# Patient Record
Sex: Female | Born: 1960 | Race: White | Hispanic: No | Marital: Single | Smoking: Never smoker
Health system: Southern US, Community
[De-identification: ages and names within clinical notes are randomized; demographics above are authoritative.]

## PROBLEM LIST (undated history)

## (undated) DIAGNOSIS — E079 Disorder of thyroid, unspecified: Secondary | ICD-10-CM

## (undated) DIAGNOSIS — F32A Depression, unspecified: Secondary | ICD-10-CM

## (undated) DIAGNOSIS — E063 Autoimmune thyroiditis: Secondary | ICD-10-CM

## (undated) DIAGNOSIS — T7840XA Allergy, unspecified, initial encounter: Secondary | ICD-10-CM

## (undated) DIAGNOSIS — F329 Major depressive disorder, single episode, unspecified: Secondary | ICD-10-CM

## (undated) DIAGNOSIS — Z808 Family history of malignant neoplasm of other organs or systems: Secondary | ICD-10-CM

## (undated) DIAGNOSIS — F419 Anxiety disorder, unspecified: Secondary | ICD-10-CM

## (undated) DIAGNOSIS — Z803 Family history of malignant neoplasm of breast: Secondary | ICD-10-CM

## (undated) DIAGNOSIS — E785 Hyperlipidemia, unspecified: Secondary | ICD-10-CM

## (undated) DIAGNOSIS — Z800 Family history of malignant neoplasm of digestive organs: Secondary | ICD-10-CM

## (undated) DIAGNOSIS — Z8042 Family history of malignant neoplasm of prostate: Secondary | ICD-10-CM

## (undated) DIAGNOSIS — Z8041 Family history of malignant neoplasm of ovary: Secondary | ICD-10-CM

## (undated) DIAGNOSIS — D649 Anemia, unspecified: Secondary | ICD-10-CM

## (undated) HISTORY — DX: Autoimmune thyroiditis: E06.3

## (undated) HISTORY — PX: COLONOSCOPY: SHX174

## (undated) HISTORY — PX: MYRINGOTOMY: SUR874

## (undated) HISTORY — DX: Family history of malignant neoplasm of breast: Z80.3

## (undated) HISTORY — DX: Allergy, unspecified, initial encounter: T78.40XA

## (undated) HISTORY — DX: Family history of malignant neoplasm of ovary: Z80.41

## (undated) HISTORY — PX: KNEE SURGERY: SHX244

## (undated) HISTORY — DX: Family history of malignant neoplasm of prostate: Z80.42

## (undated) HISTORY — DX: Anemia, unspecified: D64.9

## (undated) HISTORY — DX: Major depressive disorder, single episode, unspecified: F32.9

## (undated) HISTORY — PX: ADENOIDECTOMY: SUR15

## (undated) HISTORY — DX: Hyperlipidemia, unspecified: E78.5

## (undated) HISTORY — DX: Family history of malignant neoplasm of digestive organs: Z80.0

## (undated) HISTORY — DX: Family history of malignant neoplasm of other organs or systems: Z80.8

## (undated) HISTORY — DX: Anxiety disorder, unspecified: F41.9

## (undated) HISTORY — DX: Depression, unspecified: F32.A

---

## 2000-10-02 ENCOUNTER — Other Ambulatory Visit: Admission: RE | Admit: 2000-10-02 | Discharge: 2000-10-02 | Payer: Self-pay | Admitting: *Deleted

## 2001-09-29 ENCOUNTER — Other Ambulatory Visit: Admission: RE | Admit: 2001-09-29 | Discharge: 2001-09-29 | Payer: Self-pay | Admitting: Obstetrics and Gynecology

## 2002-04-20 ENCOUNTER — Other Ambulatory Visit: Admission: RE | Admit: 2002-04-20 | Discharge: 2002-04-20 | Payer: Self-pay | Admitting: Obstetrics and Gynecology

## 2002-10-10 ENCOUNTER — Other Ambulatory Visit: Admission: RE | Admit: 2002-10-10 | Discharge: 2002-10-10 | Payer: Self-pay | Admitting: Obstetrics and Gynecology

## 2003-07-20 ENCOUNTER — Encounter: Payer: Self-pay | Admitting: Emergency Medicine

## 2003-07-20 ENCOUNTER — Encounter (INDEPENDENT_AMBULATORY_CARE_PROVIDER_SITE_OTHER): Payer: Self-pay | Admitting: *Deleted

## 2003-07-21 ENCOUNTER — Encounter: Payer: Self-pay | Admitting: Internal Medicine

## 2003-07-21 ENCOUNTER — Encounter: Payer: Self-pay | Admitting: Emergency Medicine

## 2003-07-21 ENCOUNTER — Inpatient Hospital Stay (HOSPITAL_COMMUNITY): Admission: EM | Admit: 2003-07-21 | Discharge: 2003-07-22 | Payer: Self-pay | Admitting: Emergency Medicine

## 2003-08-03 ENCOUNTER — Encounter: Admission: RE | Admit: 2003-08-03 | Discharge: 2003-08-03 | Payer: Self-pay | Admitting: Internal Medicine

## 2003-08-11 ENCOUNTER — Other Ambulatory Visit: Admission: RE | Admit: 2003-08-11 | Discharge: 2003-08-11 | Payer: Self-pay | Admitting: Obstetrics and Gynecology

## 2003-09-08 ENCOUNTER — Ambulatory Visit (HOSPITAL_COMMUNITY): Admission: RE | Admit: 2003-09-08 | Discharge: 2003-09-08 | Payer: Self-pay | Admitting: Internal Medicine

## 2003-09-08 ENCOUNTER — Encounter: Payer: Self-pay | Admitting: Internal Medicine

## 2003-09-12 ENCOUNTER — Encounter: Admission: RE | Admit: 2003-09-12 | Discharge: 2003-09-12 | Payer: Self-pay | Admitting: Internal Medicine

## 2004-11-08 ENCOUNTER — Other Ambulatory Visit: Admission: RE | Admit: 2004-11-08 | Discharge: 2004-11-08 | Payer: Self-pay | Admitting: Obstetrics and Gynecology

## 2005-12-30 ENCOUNTER — Other Ambulatory Visit: Admission: RE | Admit: 2005-12-30 | Discharge: 2005-12-30 | Payer: Self-pay | Admitting: Obstetrics and Gynecology

## 2006-02-27 ENCOUNTER — Ambulatory Visit: Payer: Self-pay | Admitting: Internal Medicine

## 2006-06-05 ENCOUNTER — Ambulatory Visit: Payer: Self-pay | Admitting: Internal Medicine

## 2006-07-06 ENCOUNTER — Ambulatory Visit: Payer: Self-pay | Admitting: Internal Medicine

## 2006-08-11 ENCOUNTER — Ambulatory Visit: Payer: Self-pay | Admitting: Internal Medicine

## 2006-08-18 ENCOUNTER — Ambulatory Visit: Payer: Self-pay | Admitting: Internal Medicine

## 2007-06-26 ENCOUNTER — Ambulatory Visit: Payer: Self-pay | Admitting: Internal Medicine

## 2007-10-28 ENCOUNTER — Telehealth: Payer: Self-pay | Admitting: Internal Medicine

## 2007-10-29 ENCOUNTER — Ambulatory Visit: Payer: Self-pay | Admitting: Internal Medicine

## 2008-04-22 ENCOUNTER — Ambulatory Visit: Payer: Self-pay | Admitting: Internal Medicine

## 2008-05-05 ENCOUNTER — Telehealth: Payer: Self-pay | Admitting: Internal Medicine

## 2008-12-08 HISTORY — PX: OTHER SURGICAL HISTORY: SHX169

## 2009-05-17 ENCOUNTER — Ambulatory Visit: Payer: Self-pay | Admitting: Internal Medicine

## 2009-05-17 LAB — CONVERTED CEMR LAB
ALT: 16 units/L (ref 0–35)
AST: 19 units/L (ref 0–37)
Albumin: 4 g/dL (ref 3.5–5.2)
Alkaline Phosphatase: 57 units/L (ref 39–117)
BUN: 15 mg/dL (ref 6–23)
Basophils Absolute: 0 10*3/uL (ref 0.0–0.1)
Basophils Relative: 0.1 % (ref 0.0–3.0)
Bilirubin, Direct: 0 mg/dL (ref 0.0–0.3)
CO2: 26 meq/L (ref 19–32)
Calcium: 8.8 mg/dL (ref 8.4–10.5)
Chloride: 107 meq/L (ref 96–112)
Cholesterol: 195 mg/dL (ref 0–200)
Creatinine, Ser: 0.8 mg/dL (ref 0.4–1.2)
Eosinophils Absolute: 0.1 10*3/uL (ref 0.0–0.7)
Eosinophils Relative: 1.3 % (ref 0.0–5.0)
GFR calc non Af Amer: 81.71 mL/min (ref >60)
Glucose, Bld: 91 mg/dL (ref 70–99)
Glucose, Urine, Semiquant: NEGATIVE
HCT: 37.9 % (ref 36.0–46.0)
HDL: 50 mg/dL (ref >39.00)
Hemoglobin: 12.9 g/dL (ref 12.0–15.0)
Ketones, urine, test strip: NEGATIVE
LDL Cholesterol: 128 mg/dL — ABNORMAL HIGH (ref 0–99)
Lymphocytes Relative: 29.6 % (ref 12.0–46.0)
Lymphs Abs: 1.8 10*3/uL (ref 0.7–4.0)
MCHC: 34 g/dL (ref 30.0–36.0)
MCV: 92.4 fL (ref 78.0–100.0)
Monocytes Absolute: 0.5 10*3/uL (ref 0.1–1.0)
Monocytes Relative: 7.9 % (ref 3.0–12.0)
Neutro Abs: 3.8 10*3/uL (ref 1.4–7.7)
Neutrophils Relative %: 61.1 % (ref 43.0–77.0)
Nitrite: NEGATIVE
Platelets: 255 10*3/uL (ref 150.0–400.0)
Potassium: 4.5 meq/L (ref 3.5–5.1)
RBC: 4.1 M/uL (ref 3.87–5.11)
RDW: 12.5 % (ref 11.5–14.6)
Sodium: 139 meq/L (ref 135–145)
Specific Gravity, Urine: >=1.03
TSH: 2.32 microintl units/mL (ref 0.35–5.50)
Total Bilirubin: 0.8 mg/dL (ref 0.3–1.2)
Total CHOL/HDL Ratio: 4
Total Protein: 8 g/dL (ref 6.0–8.3)
Triglycerides: 84 mg/dL (ref 0.0–149.0)
Urobilinogen, UA: 0.2
VLDL: 16.8 mg/dL (ref 0.0–40.0)
WBC Urine, dipstick: NEGATIVE
WBC: 6.2 10*3/uL (ref 4.5–10.5)
pH: 5

## 2009-06-01 ENCOUNTER — Ambulatory Visit: Payer: Self-pay | Admitting: Internal Medicine

## 2009-06-01 DIAGNOSIS — E669 Obesity, unspecified: Secondary | ICD-10-CM | POA: Insufficient documentation

## 2009-06-01 DIAGNOSIS — F329 Major depressive disorder, single episode, unspecified: Secondary | ICD-10-CM | POA: Insufficient documentation

## 2010-07-24 ENCOUNTER — Ambulatory Visit: Payer: Self-pay | Admitting: Internal Medicine

## 2010-09-09 ENCOUNTER — Ambulatory Visit (HOSPITAL_COMMUNITY)
Admission: RE | Admit: 2010-09-09 | Discharge: 2010-09-09 | Payer: Self-pay | Source: Home / Self Care | Admitting: Internal Medicine

## 2010-09-09 ENCOUNTER — Ambulatory Visit: Payer: Self-pay | Admitting: Internal Medicine

## 2010-09-09 DIAGNOSIS — M19049 Primary osteoarthritis, unspecified hand: Secondary | ICD-10-CM | POA: Insufficient documentation

## 2010-09-09 LAB — CONVERTED CEMR LAB: CRP, High Sensitivity: 5.18 — ABNORMAL HIGH (ref 0.00–5.00)

## 2010-12-28 ENCOUNTER — Encounter: Payer: Self-pay | Admitting: Internal Medicine

## 2011-01-07 NOTE — Assessment & Plan Note (Signed)
Summary: joint pain/njr   Vital Signs:  Patient profile:   50 year old female Weight:      286 pounds BMI:     42.39 Temp:     99.1 degrees F oral BP sitting:   150 / 100  (left arm) Cuff size:   large  Vitals Entered By: Sid Falcon LPN (September 09, 2010 10:11 AM)  Serial Vital Signs/Assessments:  Time      Position  BP       Pulse  Resp  Temp     By                     120/85                         Birdie Sons MD   History of Present Illness: SEVERAL MONTH HX OF "JOINT PAIN" 3rd and 4th fingers of left hand are painful at DIP joint mornings hands are stiff resolve within a few minutes has not tried OTC meds  All other systems reviewed and were negative   Current Problems (verified): 1)  Obesity  (ICD-278.00) 2)  Preventive Health Care  (ICD-V70.0) 3)  Depression  (ICD-311)  Current Medications (verified): 1)  Fluoxetine Hcl 10 Mg  Caps (Fluoxetine Hcl) .Marland Kitchen.. 1 Once Daily  Allergies: 1)  Sulfamethoxazole (Sulfamethoxazole)  Physical Exam  General:  alert and well-developed.   Head:  normocephalic and atraumatic.   Eyes:  pupils equal and pupils round.   Ears:  R ear normal and L ear normal.   Neck:  supple and full ROM.   Lungs:  normal respiratory effort.   Heart:  normal rate and regular rhythm.   Abdomen:  soft and non-tender.   Msk:  heberdens and bouchards nodes 3 and 4 fingers left Skin:  turgor normal and color normal.   Psych:  good eye contact and not anxious appearing.     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 1 once daily  Appended Document:  Orders Update     Clinical Lists Changes  Orders: Added new Service order of Specimen Handling (84696) - Signed

## 2011-01-07 NOTE — Assessment & Plan Note (Signed)
Summary: SORE THROAT // RS   Vital Signs:  Patient profile:   50 year old female Weight:      277 pounds Temp:     97.9 degrees F oral BP sitting:   110 / 90  (left arm) Cuff size:   large  Vitals Entered By: Kathrynn Speed CMA (July 24, 2010 8:31 AM) CC: Sore throat since July 29th consistantly, some sinus drainage, swelling glands, src Is Patient Diabetic? No   CC:  Sore throat since July 29th consistantly, some sinus drainage, swelling glands, and src.  History of Present Illness: ST since 7/29 started with sneezing and other URI sxs ST is mild to moderate she notes some postnasal drainage and she feels like "glands" are sore---not swollen tried otc sinus meds---no results   In addition she is menopausal---a lot of night sweats.   Preventive Screening-Counseling & Management  Alcohol-Tobacco     Smoking Status: never  Current Medications (verified): 1)  Fluoxetine Hcl 10 Mg  Caps (Fluoxetine Hcl) .Marland Kitchen.. 1 Once Daily  Allergies (verified): 1)  Sulfamethoxazole (Sulfamethoxazole)  Past History:  Past Medical History: Last updated: 06/01/2009 Depression  Past Surgical History: Last updated: 06/01/2009 adenoidectomy myringotomy---age 36  Family History: Last updated: 06/01/2009 mother dm, lipids, htn Family History Breast cancer 1st degree relative- mother Family History Diabetes 1st degree relative Family History High cholesterol Family History Hypertension Family History Lung cancer-father age 62 yo  Social History: Last updated: 06/01/2009 no tob Single Never Smoked Alcohol use-yes < 1 daily 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   Head:  normocephalic and atraumatic.   Mouth:  pharyngeal erythema.  tonsils enlarged Neck:  supple and full ROM.   Lungs:  Normal respiratory effort, chest expands symmetrically. Lungs are clear to  auscultation, no crackles or wheezes. Heart:  normal rate and regular rhythm.     Impression & Recommendations:  Problem # 1:  SINUSITIS- ACUTE-NOS (ICD-461.9) Assessment Unchanged she understands this is best guess diagnosis will treat antibiotic  side effects discussed Her updated medication list for this problem includes:    Doxycycline Hyclate 100 Mg Caps (Doxycycline hyclate) .Marland Kitchen... Take 1 tab twice a day  Complete Medication List: 1)  Fluoxetine Hcl 10 Mg Caps (Fluoxetine hcl) .Marland Kitchen.. 1 once daily 2)  Doxycycline Hyclate 100 Mg Caps (Doxycycline hyclate) .... Take 1 tab twice a day Prescriptions: DOXYCYCLINE HYCLATE 100 MG CAPS (DOXYCYCLINE HYCLATE) Take 1 tab twice a day  #20 x 0   Entered and Authorized by:   Birdie Sons MD   Signed by:   Birdie Sons MD on 07/24/2010   Method used:   Electronically to        [facility]. [phone]* (retail)       [phone] [address]       [place], [place]  [address]       Ph: [phone]       Fax: [phone]   RxID:   [id number]

## 2011-05-28 ENCOUNTER — Other Ambulatory Visit (INDEPENDENT_AMBULATORY_CARE_PROVIDER_SITE_OTHER): Payer: PRIVATE HEALTH INSURANCE

## 2011-05-28 DIAGNOSIS — Z0000 Encounter for general adult medical examination without abnormal findings: Secondary | ICD-10-CM

## 2011-05-28 LAB — BASIC METABOLIC PANEL
CO2: 26 mEq/L (ref 19–32)
Calcium: 9.4 mg/dL (ref 8.4–10.5)
Chloride: 107 mEq/L (ref 96–112)
Creatinine, Ser: 0.7 mg/dL (ref 0.4–1.2)
Glucose, Bld: 89 mg/dL (ref 70–99)

## 2011-05-28 LAB — CBC WITH DIFFERENTIAL/PLATELET
Basophils Absolute: 0 10*3/uL (ref 0.0–0.1)
Basophils Relative: 0.4 % (ref 0.0–3.0)
Eosinophils Absolute: 0.1 10*3/uL (ref 0.0–0.7)
Lymphocytes Relative: 38.5 % (ref 12.0–46.0)
MCHC: 33.5 g/dL (ref 30.0–36.0)
MCV: 93.8 fl (ref 78.0–100.0)
Monocytes Absolute: 0.4 10*3/uL (ref 0.1–1.0)
Neutro Abs: 2.7 10*3/uL (ref 1.4–7.7)
Neutrophils Relative %: 51.8 % (ref 43.0–77.0)
RBC: 4.31 Mil/uL (ref 3.87–5.11)
RDW: 13.8 % (ref 11.5–14.6)

## 2011-05-28 LAB — POCT URINALYSIS DIPSTICK
Ketones, UA: NEGATIVE
Protein, UA: NEGATIVE
Spec Grav, UA: 1.015
Urobilinogen, UA: 0.2
pH, UA: 5

## 2011-05-28 LAB — HEPATIC FUNCTION PANEL
Albumin: 4.1 g/dL (ref 3.5–5.2)
Alkaline Phosphatase: 57 U/L (ref 39–117)
Bilirubin, Direct: 0.1 mg/dL (ref 0.0–0.3)
Total Protein: 6.8 g/dL (ref 6.0–8.3)

## 2011-05-28 LAB — LIPID PANEL
HDL: 59 mg/dL (ref >39.00)
Triglycerides: 75 mg/dL (ref 0.0–149.0)
VLDL: 15 mg/dL (ref 0.0–40.0)

## 2011-06-10 ENCOUNTER — Encounter: Payer: Self-pay | Admitting: Internal Medicine

## 2011-06-10 ENCOUNTER — Ambulatory Visit (INDEPENDENT_AMBULATORY_CARE_PROVIDER_SITE_OTHER): Payer: PRIVATE HEALTH INSURANCE | Admitting: Internal Medicine

## 2011-06-10 VITALS — BP 104/78 | HR 68 | Temp 98.5°F | Ht 69.25 in | Wt 281.0 lb

## 2011-06-10 DIAGNOSIS — Z0000 Encounter for general adult medical examination without abnormal findings: Secondary | ICD-10-CM

## 2011-06-10 DIAGNOSIS — E039 Hypothyroidism, unspecified: Secondary | ICD-10-CM

## 2011-06-10 MED ORDER — LEVOTHYROXINE SODIUM 50 MCG PO TABS: 50.0000 ug | ORAL_TABLET | Freq: Every day | ORAL | Status: DC

## 2011-06-10 NOTE — Progress Notes (Signed)
  Subjective:    Patient ID: Elizabeth Gentry, female    DOB: 03/04/1961, 50 y.o.   MRN: [id number]  HPI  cpx  Past Medical History  Diagnosis Date  . Depression    Past Surgical History  Procedure Date  . Adenoidectomy   . Myringotomy   . Uterine ablation- 2010 2010    dr holland for AUB    reports that she has never smoked. She does not have any smokeless tobacco history on file. She reports that she drinks about 1.8 ounces of alcohol per week. She reports that she does not use illicit drugs. family history includes Cancer in her father and mother; Cancer (age of onset:60) in her maternal grandmother; Diabetes in her mother; Hyperlipidemia in her mother; and Hypertension in her mother. Allergies  Allergen Reactions  . Sulfamethoxazole     REACTION: unspecified     Review of Systems  patient denies chest pain, shortness of breath, orthopnea. Denies lower extremity edema, abdominal pain, change in appetite, change in bowel movements. Patient denies rashes, musculoskeletal complaints. No other specific complaints in a complete review of systems.      Objective:   Physical Exam  Well-developed well-nourished female in no acute distress. HEENT exam atraumatic, normocephalic, extraocular muscles are intact. Neck is supple. No jugular venous distention no thyromegaly. Chest clear to auscultation without increased work of breathing. Cardiac exam S1 and S2 are regular. Abdominal exam active bowel sounds, soft, nontender. Extremities no edema. Neurologic exam she is alert without any motor sensory deficits. Gait is normal.        Assessment & Plan:

## 2011-09-09 ENCOUNTER — Other Ambulatory Visit (INDEPENDENT_AMBULATORY_CARE_PROVIDER_SITE_OTHER): Payer: PRIVATE HEALTH INSURANCE

## 2011-09-09 DIAGNOSIS — E039 Hypothyroidism, unspecified: Secondary | ICD-10-CM

## 2011-09-09 LAB — LIPID PANEL
HDL: 55.9 mg/dL (ref >39.00)
Total CHOL/HDL Ratio: 4
Triglycerides: 86 mg/dL (ref 0.0–149.0)

## 2011-09-09 LAB — TSH: TSH: 3.94 u[IU]/mL (ref 0.35–5.50)

## 2011-09-11 ENCOUNTER — Telehealth: Payer: Self-pay | Admitting: Internal Medicine

## 2011-09-11 DIAGNOSIS — E039 Hypothyroidism, unspecified: Secondary | ICD-10-CM

## 2011-09-11 DIAGNOSIS — E785 Hyperlipidemia, unspecified: Secondary | ICD-10-CM

## 2011-09-11 MED ORDER — LEVOTHYROXINE SODIUM 50 MCG PO TABS: 50.0000 ug | ORAL_TABLET | Freq: Every day | ORAL | Status: DC

## 2011-09-11 NOTE — Telephone Encounter (Signed)
Gave pt lab results and numbers, sent in a rx for 90 day supply for the synthroid per pt request

## 2011-09-11 NOTE — Telephone Encounter (Signed)
Pt would like blood work result  °

## 2011-12-31 ENCOUNTER — Other Ambulatory Visit (INDEPENDENT_AMBULATORY_CARE_PROVIDER_SITE_OTHER): Payer: PRIVATE HEALTH INSURANCE

## 2011-12-31 DIAGNOSIS — E039 Hypothyroidism, unspecified: Secondary | ICD-10-CM

## 2011-12-31 DIAGNOSIS — E785 Hyperlipidemia, unspecified: Secondary | ICD-10-CM

## 2011-12-31 LAB — LDL CHOLESTEROL, DIRECT: Direct LDL: 139.8 mg/dL

## 2011-12-31 LAB — TSH: TSH: 2.62 u[IU]/mL (ref 0.35–5.50)

## 2011-12-31 LAB — LIPID PANEL: Total CHOL/HDL Ratio: 3

## 2012-01-05 ENCOUNTER — Telehealth: Payer: Self-pay | Admitting: *Deleted

## 2012-01-05 NOTE — Telephone Encounter (Signed)
Lab Results  Component Value Date   TSH 2.62 12/31/2011    Check tsh in one year  Lipid Panel     Component Value Date/Time   CHOL 212* 12/31/2011 1002   TRIG 59.0 12/31/2011 1002   HDL 63.20 12/31/2011 1002   CHOLHDL 3 12/31/2011 1002   VLDL 11.8 12/31/2011 1002   LDLCALC 128* 05/17/2009 0945

## 2012-01-05 NOTE — Telephone Encounter (Signed)
Pt wanted to know when you wanted to recheck her thyroid and if you wanted her to take any meds for the cholesterol.  She stated that you told her you might want to start testing thyroid every 3 months?

## 2012-01-08 NOTE — Telephone Encounter (Signed)
Pt aware.

## 2012-09-28 ENCOUNTER — Other Ambulatory Visit: Payer: Self-pay | Admitting: Internal Medicine

## 2012-09-28 MED ORDER — FLUOXETINE HCL 10 MG PO CAPS: 10.0000 mg | ORAL_CAPSULE | Freq: Every day | ORAL | Status: DC

## 2012-09-28 NOTE — Telephone Encounter (Signed)
rx sent in electronically 

## 2012-09-28 NOTE — Telephone Encounter (Addendum)
Pt needs refill on levothyroxine 10mg  call into [facility] [address]. Pt has appt sch for 11-15-2012. Pt unable to come in sooner due to Ins coverage starts on 11-07-2012. Pt has one pill left

## 2012-09-29 ENCOUNTER — Telehealth: Payer: Self-pay | Admitting: Internal Medicine

## 2012-09-29 MED ORDER — LEVOTHYROXINE SODIUM 50 MCG PO TABS: 50.0000 ug | ORAL_TABLET | Freq: Every day | ORAL | Status: DC

## 2012-09-29 NOTE — Telephone Encounter (Signed)
rx sent in electronically 

## 2012-09-29 NOTE — Telephone Encounter (Signed)
Pt called to check on status of getting script for Levothyroxine called in to the [facility] on [address] at [place]. Pt is completely of of med.  Pt req that this be called in today asap. Pls call pt when done.

## 2012-09-29 NOTE — Telephone Encounter (Signed)
Patient called stating that she need a refill of her levothyroxine as she is completely out. Please assist and send to [facility] at [address].

## 2012-10-02 ENCOUNTER — Emergency Department (HOSPITAL_COMMUNITY): Payer: PRIVATE HEALTH INSURANCE

## 2012-10-02 ENCOUNTER — Encounter (HOSPITAL_COMMUNITY): Payer: Self-pay | Admitting: Emergency Medicine

## 2012-10-02 ENCOUNTER — Emergency Department (HOSPITAL_COMMUNITY)
Admission: EM | Admit: 2012-10-02 | Discharge: 2012-10-02 | Disposition: A | Discharge status: Home / Self Care | Payer: PRIVATE HEALTH INSURANCE | Attending: Emergency Medicine | Admitting: Emergency Medicine

## 2012-10-02 DIAGNOSIS — E079 Disorder of thyroid, unspecified: Secondary | ICD-10-CM | POA: Insufficient documentation

## 2012-10-02 DIAGNOSIS — R112 Nausea with vomiting, unspecified: Secondary | ICD-10-CM | POA: Insufficient documentation

## 2012-10-02 DIAGNOSIS — F329 Major depressive disorder, single episode, unspecified: Secondary | ICD-10-CM | POA: Insufficient documentation

## 2012-10-02 DIAGNOSIS — R109 Unspecified abdominal pain: Secondary | ICD-10-CM

## 2012-10-02 DIAGNOSIS — R10A0 Flank pain, unspecified side: Secondary | ICD-10-CM

## 2012-10-02 DIAGNOSIS — N200 Calculus of kidney: Secondary | ICD-10-CM

## 2012-10-02 DIAGNOSIS — F3289 Other specified depressive episodes: Secondary | ICD-10-CM | POA: Insufficient documentation

## 2012-10-02 DIAGNOSIS — Z79899 Other long term (current) drug therapy: Secondary | ICD-10-CM | POA: Insufficient documentation

## 2012-10-02 DIAGNOSIS — R300 Dysuria: Secondary | ICD-10-CM | POA: Insufficient documentation

## 2012-10-02 HISTORY — DX: Disorder of thyroid, unspecified: E07.9

## 2012-10-02 LAB — URINE MICROSCOPIC-ADD ON

## 2012-10-02 LAB — URINALYSIS, ROUTINE W REFLEX MICROSCOPIC
Glucose, UA: NEGATIVE mg/dL
Leukocytes, UA: NEGATIVE
Nitrite: NEGATIVE
Protein, ur: NEGATIVE mg/dL
pH: 5.5 (ref 5.0–8.0)

## 2012-10-02 LAB — BASIC METABOLIC PANEL
CO2: 23 mEq/L (ref 19–32)
GFR calc non Af Amer: 59 mL/min — ABNORMAL LOW (ref >90)
Glucose, Bld: 122 mg/dL — ABNORMAL HIGH (ref 70–99)
Potassium: 3.8 mEq/L (ref 3.5–5.1)
Sodium: 133 mEq/L — ABNORMAL LOW (ref 135–145)

## 2012-10-02 MED ORDER — KETOROLAC TROMETHAMINE 30 MG/ML IJ SOLN
30.0000 mg | Freq: Once | INTRAMUSCULAR | Status: AC
Start: 2012-10-02 — End: 2012-10-02
  Administered 2012-10-02: 30 mg via INTRAVENOUS
  Filled 2012-10-02: qty 1

## 2012-10-02 MED ORDER — ONDANSETRON HCL 4 MG/2ML IJ SOLN
4.0000 mg | Freq: Once | INTRAMUSCULAR | Status: AC
  Administered 2012-10-02: 4 mg via INTRAVENOUS
  Filled 2012-10-02: qty 2

## 2012-10-02 MED ORDER — IBUPROFEN 600 MG PO TABS: 600.0000 mg | ORAL_TABLET | Freq: Four times a day (QID) | ORAL | Status: DC | PRN

## 2012-10-02 MED ORDER — HYDROMORPHONE HCL PF 1 MG/ML IJ SOLN
0.5000 mg | Freq: Once | INTRAMUSCULAR | Status: AC
  Administered 2012-10-02: 0.5 mg via INTRAVENOUS
  Filled 2012-10-02: qty 1

## 2012-10-02 MED ORDER — PHENAZOPYRIDINE HCL 200 MG PO TABS: 200.0000 mg | ORAL_TABLET | Freq: Three times a day (TID) | ORAL | Status: DC

## 2012-10-02 MED ORDER — OXYCODONE-ACETAMINOPHEN 5-325 MG PO TABS: 1.0000 | ORAL_TABLET | Freq: Four times a day (QID) | ORAL | Status: DC | PRN

## 2012-10-02 MED ORDER — ONDANSETRON HCL 4 MG PO TABS: 4.0000 mg | ORAL_TABLET | Freq: Four times a day (QID) | ORAL | Status: DC

## 2012-10-02 NOTE — ED Provider Notes (Signed)
History     CSN: [id number]  Arrival date & time 10/02/12  1418   First MD Initiated Contact with Patient 10/02/12 1441      Chief Complaint  Patient presents with  . Flank Pain    (Consider location/radiation/quality/duration/timing/severity/associated sxs/prior treatment) HPIHeidi Gentry is a 51 y.o. female presents with left flank pain. Pain started higher in her back and is currently low below her hip in the left flank, it is 10/10, sharp, she cannot get comfortable, she's noticed darker urine, no fevers or chills, she's had associated nausea vomiting, she's also had some dysuria, and frequency. Denies any abdominal pain, vaginal discharge, headache chest pain or shortness of breath.    Past Medical History  Diagnosis Date  . Depression   . Thyroid disease     Past Surgical History  Procedure Date  . Adenoidectomy   . Myringotomy   . Uterine ablation- 2010 2010    dr holland for AUB    Family History  Problem Relation Age of Onset  . Cancer Mother     breast  . Diabetes Mother   . Hypertension Mother   . Hyperlipidemia Mother   . Cancer Father     lung  . Cancer Maternal Grandmother 60    breast CA    History  Substance Use Topics  . Smoking status: Never Smoker   . Smokeless tobacco: Not on file  . Alcohol Use: 1.8 oz/week    3 Glasses of wine per week    OB History    Grav Para Term Preterm Abortions TAB SAB Ect Mult Living                  Review of Systems At least 10pt or greater review of systems completed and are negative except where specified in the HPI.  Allergies  Sulfamethoxazole  Home Medications   Current Outpatient Rx  Name Route Sig Dispense Refill  . FLUOXETINE HCL 10 MG PO CAPS Oral Take 1 capsule (10 mg total) by mouth daily. 30 capsule 1  . LEVOTHYROXINE SODIUM 50 MCG PO TABS Oral Take 1 tablet (50 mcg total) by mouth daily. 90 tablet 3  . LEVOTHYROXINE SODIUM 50 MCG PO TABS Oral Take 1 tablet (50 mcg total) by mouth  daily. 90 tablet 0    BP 163/145  Pulse 57  Temp 97.8 F (36.6 C) (Oral)  Resp 20  SpO2 100%  Physical Exam  Nursing notes reviewed.  Electronic medical record reviewed. VITAL SIGNS:   Filed Vitals:   10/02/12 1429 10/02/12 1520 10/02/12 1658  BP: 163/145 149/93 132/78  Pulse: 57 42 80  Temp: 97.8 F (36.6 C) 97.8 F (36.6 C) 98 F (36.7 C)  TempSrc: Oral Oral Oral  Resp: 20    SpO2: 100%  100%   CONSTITUTIONAL: Awake, oriented, appears non-toxic HENT: Atraumatic, normocephalic, oral mucosa pink and moist, airway patent. Nares patent without drainage. External ears normal. EYES: Conjunctiva clear, EOMI, PERRLA NECK: Trachea midline, non-tender, supple CARDIOVASCULAR: Normal heart rate, Normal rhythm, No murmurs, rubs, gallops PULMONARY/CHEST: Clear to auscultation, no rhonchi, wheezes, or rales. Symmetrical breath sounds. Non-tender. ABDOMINAL: Non-distended, obese, soft, non-tender - no rebound or guarding.  BS normal. No flank pain to percussion. NEUROLOGIC: Non-focal, moving all four extremities, no gross sensory or motor deficits. EXTREMITIES: No clubbing, cyanosis, or edema SKIN: Warm, Dry, No erythema, No rash  ED Course  Procedures (including critical care time)  Labs Reviewed  URINALYSIS, ROUTINE W REFLEX MICROSCOPIC -  Abnormal; Notable for the following:    APPearance CLOUDY (*)     Hgb urine dipstick LARGE (*)     All other components within normal limits  BASIC METABOLIC PANEL - Abnormal; Notable for the following:    Sodium 133 (*)     Glucose, Bld 122 (*)     GFR calc non Af Amer 59 (*)     GFR calc Af Amer 69 (*)     All other components within normal limits  URINE MICROSCOPIC-ADD ON - Abnormal; Notable for the following:    Squamous Epithelial / LPF FEW (*)     Bacteria, UA FEW (*)     All other components within normal limits   Ct Abdomen Pelvis Wo Contrast  10/02/2012  *RADIOLOGY REPORT*  Clinical Data: 51 year old female with left flank,  abdominal and pelvic pain.  CT ABDOMEN AND PELVIS WITHOUT CONTRAST  Technique:  Multidetector CT imaging of the abdomen and pelvis was performed following the standard protocol without intravenous contrast.  Comparison: None  Findings: The appendix is enlarged with adjacent inflammation - worrisome for appendicitis.  There is left perinephric inflammation and moderate left hydroureteronephrosis extending to the bladder.  A 3 mm calcification / calculus lies within the posterior right bladder and most likely has passed from the left side.  The liver, spleen, gallbladder, pancreas, right kidney and adrenal glands are unremarkable.  Please note that parenchymal abnormalities may be missed as intravenous contrast was not administered.  There is no evidence of free fluid, enlarged lymph nodes, biliary dilatation or abdominal aortic aneurysm.  No acute or suspicious bony abnormalities are identified.  IMPRESSION: Enlarged appendix with adjacent inflammation - worrisome for appendicitis.  3 mm calculus within the posterior right bladder which is likely passed from the left side, where there is moderate left hydroureteronephrosis and perinephric inflammation.   Original Report Authenticated By: Rosendo Gros, M.D.      1. Kidney stone   2. Flank pain       MDM  Elizabeth Gentry is a 51 y.o. female presenting with likely kidney stone. Analgesia, antiemetics, labs and CT of the abdomen and pelvis ordered.  Patient's urine is many red blood cells, and CT shows a 3 mm stone in the bladder and some minimal left-sided hydronephrosis.   Discussed results of the CT scan with Dr. Si Gaul, he says he's concerned about an enlarged appendix with adjacent inflammation is concerning him for appendicitis. The patient has absolutely no right lower cord tenderness on my first exam or on subsequent serial abdominal exams. Likely she does not have appendicitis at this time - a white blood cell count is not to change my management at  this time either. I have given the patient explicit instructions to return to any ER if she develops any right lower quadrant pain or vomiting or any symptoms of appendicitis. Currently the patient is comfortable, she is passed a stone into her bladder, she understands accepts the medical plan - will followup with urology or will come back to the ER should any new right lower quadrant pain start.           Jones Skene, MD 10/02/12 1706

## 2012-10-02 NOTE — ED Notes (Signed)
Per patient starting having pain 2 days ago, N/V-left flank pain increased-unable to get comfortable, dysuria

## 2012-10-02 NOTE — ED Notes (Signed)
Patient transported to CT 

## 2012-10-02 NOTE — ED Notes (Signed)
[id number]<JY> Expected date:<BR> Expected time:<BR> Means of arrival:<BR> Comments:<BR> Hold

## 2012-10-03 ENCOUNTER — Telehealth: Payer: Self-pay | Admitting: Internal Medicine

## 2012-10-03 NOTE — Telephone Encounter (Signed)
Caller: [place]/Patient; PCP: Birdie Sons (Adults only); CB#: [phone]; Call regarding Seen in ED yesterday, passed kidney stones; told has "hot appendix" per CAT scan, needs to be seen Mon AM.  States seen by Dr. Rulon Abide and told the kidney stone had passed and was in the bladder, but that he saw her appendix needed followup.  Told could follow up with her MD in follow up.  States has well visit scheduled 11/15/12 but was told that she needed to be seen sooner than then.  Caller would like follow up appt.  Denies pain or symptoms at this time.  Afebrile.  Per abdominal pain protocol, emergent symptoms denied; advised appt within 24 hours.  Per Epic, no appt available with Dr. Cato Mulligan within that time frame; info to office for staff review/appt management.   May reach patient at [phone].

## 2012-10-04 ENCOUNTER — Telehealth: Payer: Self-pay | Admitting: Family Medicine

## 2012-10-04 NOTE — Telephone Encounter (Signed)
appt scheduled for 9:30 10/05/12

## 2012-10-04 NOTE — Telephone Encounter (Signed)
[facility] Triage Call Report Triage Record Num: [id number] Operator: Chevis Pretty Patient Name: Elizabeth Gentry Call Date & Time: 10/03/2012 9:14:01AM Patient Phone: [phone] PCP: Valetta Mole. [facility]words Patient Gender: Female PCP Fax : [phone] Patient DOB: [birth year] [facility] Name[facility] Reason for Call: Caller: [place]/Patient; PCP: Birdie Sons (Adults only); CB#: [phone]; Call regarding Seen in ED yesterday, passed kidney stones; told has "hot appendix" per CAT scan, needs to be seen Mon AM. States seen by Dr. Rulon Abide and told the kidney stone had passed and was in the bladder, but that he saw her appendix needed followup. Told could follow up with her MD in follow up. States has well visit scheduled 11/15/12 but was told that she needed to be seen sooner than then. Caller would like follow up appt. Denies pain or symptoms at this time. Afebrile. Per abdominal pain protocol, emergent symptoms denied; advised appt within 24 hours. Per Epic, no appt available with Dr. Cato Mulligan within that time frame; info to office for staff review/appt management. May reach patient at [phone]. Protocol(s) Used: Abdominal Pain Recommended Outcome per Protocol: See Provider within 24 hours Reason for Outcome: New onset constant mild or aching lower abdominal pain Care Advice: ~ Soak in warm bath or place heating pad set on low on abdomen to aid in pain relief. Lying down with legs elevated supported by pillow under knees or lying on side with knees pulled up to chest may help provide relief during more severe cramping. ~ ~ Rest as much as possible. ~ Pain medication or laxatives should not be taken until symptoms are evaluated. Call provider immediately if pain gets worse, localized to one spot or lasts more than 4 hours and prevents normal activity; tell provider if vomiting begins after onset of pain or if having any fever. ~ ~ SYMPTOM / CONDITION MANAGEMENT ~  CAUTIONS 10/03/2012 9:35:49AM Page 1 of 1 CAN_TriageRpt

## 2012-10-05 ENCOUNTER — Ambulatory Visit (INDEPENDENT_AMBULATORY_CARE_PROVIDER_SITE_OTHER): Payer: PRIVATE HEALTH INSURANCE | Admitting: Internal Medicine

## 2012-10-05 ENCOUNTER — Encounter: Payer: Self-pay | Admitting: Internal Medicine

## 2012-10-05 VITALS — BP 142/98 | Temp 98.9°F | Wt 269.0 lb

## 2012-10-05 DIAGNOSIS — K37 Unspecified appendicitis: Secondary | ICD-10-CM

## 2012-10-05 DIAGNOSIS — E039 Hypothyroidism, unspecified: Secondary | ICD-10-CM

## 2012-10-05 LAB — CBC WITH DIFFERENTIAL/PLATELET
Basophils Absolute: 0 10*3/uL (ref 0.0–0.1)
Basophils Relative: 0.4 % (ref 0.0–3.0)
Eosinophils Absolute: 0.1 10*3/uL (ref 0.0–0.7)
Lymphocytes Relative: 32.4 % (ref 12.0–46.0)
MCHC: 32.7 g/dL (ref 30.0–36.0)
Monocytes Absolute: 0.5 10*3/uL (ref 0.1–1.0)
Neutrophils Relative %: 56.4 % (ref 43.0–77.0)
Platelets: 240 10*3/uL (ref 150.0–400.0)
RBC: 4.24 Mil/uL (ref 3.87–5.11)

## 2012-10-05 LAB — TSH: TSH: 3.98 u[IU]/mL (ref 0.35–5.50)

## 2012-10-05 LAB — SEDIMENTATION RATE: Sed Rate: 37 mm/hr — ABNORMAL HIGH (ref 0–22)

## 2012-10-05 MED ORDER — AMOXICILLIN-POT CLAVULANATE 875-125 MG PO TABS: 1.0000 | ORAL_TABLET | Freq: Two times a day (BID) | ORAL | Status: AC

## 2012-10-06 ENCOUNTER — Encounter: Payer: Self-pay | Admitting: Internal Medicine

## 2012-10-07 ENCOUNTER — Telehealth: Payer: Self-pay | Admitting: Internal Medicine

## 2012-10-07 ENCOUNTER — Encounter: Payer: Self-pay | Admitting: Internal Medicine

## 2012-10-07 NOTE — Telephone Encounter (Signed)
Please write letter addressing the following-- THANKS!  ----- Message from [facility] to Lindley Magnus, MD sent at 10/07/2012 11:07 AM ----- Due to the ER visit on Saturday and the Appendix follow up on Tuesday, I missed a conference for work. I was supposed to fly out on Sunday. In order to reimburse the airfare, the airline is requiring a letter from your office on letterhead stating I was unable to fly on Sunday due to medical reasons (appendicitis). Could you please send this to my work email address? It is [email or web address]. Thank you, Lexey

## 2012-10-07 NOTE — Telephone Encounter (Signed)
Letter is ready for p/u or mail.  L/m for pt to call back let me know which one she wants

## 2012-10-07 NOTE — Progress Notes (Signed)
Patient ID: Elizabeth Gentry, female   DOB: 1961/02/24, 51 y.o.   MRN: [id number] Recent ED visit for nephrolithiasis Incidental CT finding of "appendicitis"  Pt feels much better but admits that RLQ feels bruised. Pt denies fever, chills, N/V. She has a normal appetite  Reviewed CT scan and labs  Past Medical History  Diagnosis Date  . Depression   . Thyroid disease     History   Social History  . Marital Status: Single    Spouse Name: N/A    Number of Children: N/A  . Years of Education: N/A   Occupational History  . Not on file.   Social History Main Topics  . Smoking status: Never Smoker   . Smokeless tobacco: Not on file  . Alcohol Use: 1.8 oz/week    3 Glasses of wine per week  . Drug Use: No  . Sexually Active:    Other Topics Concern  . Not on file   Social History Narrative  . No narrative on file    Past Surgical History  Procedure Date  . Adenoidectomy   . Myringotomy   . Uterine ablation- 2010 2010    dr holland for AUB    Family History  Problem Relation Age of Onset  . Cancer Mother     breast  . Diabetes Mother   . Hypertension Mother   . Hyperlipidemia Mother   . Cancer Father     lung  . Cancer Maternal Grandmother 60    breast CA    Allergies  Allergen Reactions  . Sulfamethoxazole     REACTION: unspecified    Current Outpatient Prescriptions on File Prior to Visit  Medication Sig Dispense Refill  . FLUoxetine (PROZAC) 10 MG capsule Take 1 capsule (10 mg total) by mouth daily.  30 capsule  1  . levothyroxine (SYNTHROID) 50 MCG tablet Take 1 tablet (50 mcg total) by mouth daily.  90 tablet  0     patient denies chest pain, shortness of breath, orthopnea. Denies lower extremity edema, abdominal pain, change in appetite, change in bowel movements. Patient denies rashes, musculoskeletal complaints. No other specific complaints in a complete review of systems.   BP 142/98  Temp 98.9 F (37.2 C) (Oral)  Wt 269 lb (122.018 kg)  Well-developed well-nourished female in no acute distress. HEENT exam atraumatic, normocephalic, extraocular muscles are intact. Neck is supple. No jugular venous distention no thyromegaly. Chest clear to auscultation without increased work of breathing. Cardiac exam S1 and S2 are regular. Abdominal exam active bowel sounds, soft, mild tnderness to deep palpation in LQ. R>L. Extremities no edema. Neurologic exam she is alert without any motor sensory deficits. Gait is normal.   A/p- Appendicitis as incidental finding- i don't think her clinical sxs fit the description of the CT scan. I;'m going to evaluate labs, treat emperically with abx and ask her to contact me with ANY worsening sxs

## 2012-10-08 LAB — HM MAMMOGRAPHY

## 2012-11-15 ENCOUNTER — Encounter: Payer: Self-pay | Admitting: Internal Medicine

## 2012-11-15 ENCOUNTER — Encounter: Payer: Self-pay | Admitting: Gastroenterology

## 2012-11-15 ENCOUNTER — Ambulatory Visit (INDEPENDENT_AMBULATORY_CARE_PROVIDER_SITE_OTHER): Payer: Managed Care, Other (non HMO) | Admitting: Internal Medicine

## 2012-11-15 VITALS — BP 130/80 | HR 64 | Temp 98.0°F | Ht 69.75 in | Wt 273.0 lb

## 2012-11-15 DIAGNOSIS — Z0000 Encounter for general adult medical examination without abnormal findings: Secondary | ICD-10-CM

## 2012-11-15 LAB — HEPATIC FUNCTION PANEL
Albumin: 3.9 g/dL (ref 3.5–5.2)
Alkaline Phosphatase: 54 U/L (ref 39–117)
Total Protein: 7.5 g/dL (ref 6.0–8.3)

## 2012-11-15 LAB — CBC WITH DIFFERENTIAL/PLATELET
Basophils Relative: 0.4 % (ref 0.0–3.0)
Eosinophils Relative: 1.3 % (ref 0.0–5.0)
Hemoglobin: 13.1 g/dL (ref 12.0–15.0)
Lymphocytes Relative: 33.4 % (ref 12.0–46.0)
Monocytes Relative: 9.2 % (ref 3.0–12.0)
Neutro Abs: 3.3 10*3/uL (ref 1.4–7.7)
Neutrophils Relative %: 55.7 % (ref 43.0–77.0)
RBC: 4.3 Mil/uL (ref 3.87–5.11)
WBC: 5.9 10*3/uL (ref 4.5–10.5)

## 2012-11-15 LAB — LDL CHOLESTEROL, DIRECT: Direct LDL: 172.5 mg/dL

## 2012-11-15 LAB — LIPID PANEL
HDL: 65.1 mg/dL (ref >39.00)
Triglycerides: 84 mg/dL (ref 0.0–149.0)
VLDL: 16.8 mg/dL (ref 0.0–40.0)

## 2012-11-15 LAB — TSH: TSH: 4.55 u[IU]/mL (ref 0.35–5.50)

## 2012-11-15 MED ORDER — MELOXICAM 7.5 MG PO TABS: 7.5000 mg | ORAL_TABLET | Freq: Every day | ORAL | Status: DC

## 2012-11-15 NOTE — Progress Notes (Signed)
Patient ID: Elizabeth Gentry, female   DOB: [birth year], 51 y.o.   MRN: [id number] cpx  Needs colonoscopy Has noted left hip pain- with exertion- pain persists after walking for up to 48 hours. Pain does not occur until after she stops walking Past Medical History  Diagnosis Date  . Depression   . Thyroid disease     History   Social History  . Marital Status: Single    Spouse Name: N/A    Number of Children: N/A  . Years of Education: N/A   Occupational History  . Not on file.   Social History Main Topics  . Smoking status: Never Smoker   . Smokeless tobacco: Not on file  . Alcohol Use: 1.8 oz/week    3 Glasses of wine per week  . Drug Use: No  . Sexually Active:    Other Topics Concern  . Not on file   Social History Narrative  . No narrative on file    Past Surgical History  Procedure Date  . Adenoidectomy   . Myringotomy   . Uterine ablation- 2010 2010    dr holland for AUB    Family History  Problem Relation Age of Onset  . Cancer Mother     breast  . Diabetes Mother   . Hypertension Mother   . Hyperlipidemia Mother   . Cancer Father     lung  . Cancer Maternal Grandmother 60    breast CA    Allergies  Allergen Reactions  . Sulfamethoxazole     REACTION: unspecified    Current Outpatient Prescriptions on File Prior to Visit  Medication Sig Dispense Refill  . FLUoxetine (PROZAC) 10 MG capsule Take 1 capsule (10 mg total) by mouth daily.  30 capsule  1  . levothyroxine (SYNTHROID) 50 MCG tablet Take 1 tablet (50 mcg total) by mouth daily.  90 tablet  0     patient denies chest pain, shortness of breath, orthopnea. Denies lower extremity edema, abdominal pain, change in appetite, change in bowel movements. Patient denies rashes, musculoskeletal complaints. No other specific complaints in a complete review of systems.   BP 130/80  Pulse 64  Temp 98 F (36.7 C) (Oral)  Ht 5' 9.75" (1.772 m)  Wt 273 lb (123.832 kg)  BMI 39.45 kg/m2 obese female  in no acute distress. HEENT exam atraumatic, normocephalic, extraocular muscles are intact. Neck is supple. No jugular venous distention no thyromegaly. Chest clear to auscultation without increased work of breathing. Cardiac exam S1 and S2 are regular. Abdominal exam active bowel sounds, soft, nontender. Extremities no edema. Neurologic exam she is alert without any motor sensory deficits. Gait is normal.   A/p- well visit- health maint utd Weight loss is critical Left hip pain-? Bursitis- trial meloxicam, ice, stretching

## 2012-11-17 ENCOUNTER — Other Ambulatory Visit: Payer: Self-pay | Admitting: *Deleted

## 2012-11-17 DIAGNOSIS — E785 Hyperlipidemia, unspecified: Secondary | ICD-10-CM

## 2012-11-17 MED ORDER — ATORVASTATIN CALCIUM 20 MG PO TABS: 20.0000 mg | ORAL_TABLET | Freq: Every day | ORAL | Status: DC

## 2012-12-17 ENCOUNTER — Other Ambulatory Visit: Payer: Self-pay | Admitting: Internal Medicine

## 2012-12-20 ENCOUNTER — Ambulatory Visit (AMBULATORY_SURGERY_CENTER): Payer: Managed Care, Other (non HMO) | Admitting: *Deleted

## 2012-12-20 VITALS — Ht 70.0 in | Wt 284.0 lb

## 2012-12-20 DIAGNOSIS — Z1211 Encounter for screening for malignant neoplasm of colon: Secondary | ICD-10-CM

## 2012-12-20 MED ORDER — MOVIPREP 100 G PO SOLR: ORAL | Status: DC

## 2012-12-27 ENCOUNTER — Encounter: Payer: Self-pay | Admitting: Gastroenterology

## 2012-12-27 ENCOUNTER — Ambulatory Visit (AMBULATORY_SURGERY_CENTER): Payer: Managed Care, Other (non HMO) | Admitting: Gastroenterology

## 2012-12-27 VITALS — BP 128/54 | HR 52 | Temp 97.8°F | Resp 18 | Ht 70.0 in | Wt 284.0 lb

## 2012-12-27 DIAGNOSIS — Z1211 Encounter for screening for malignant neoplasm of colon: Secondary | ICD-10-CM

## 2012-12-27 DIAGNOSIS — D126 Benign neoplasm of colon, unspecified: Secondary | ICD-10-CM

## 2012-12-27 DIAGNOSIS — K5190 Ulcerative colitis, unspecified, without complications: Secondary | ICD-10-CM

## 2012-12-27 MED ORDER — SODIUM CHLORIDE 0.9 % IV SOLN: 500.0000 mL | INTRAVENOUS | Status: DC

## 2012-12-27 NOTE — Progress Notes (Signed)
1515 a/ox3 pleased with MAC report to April RN

## 2012-12-27 NOTE — Progress Notes (Signed)
Called to room to assist during endoscopic procedure.  Patient ID and intended procedure confirmed with present staff. Received instructions for my participation in the procedure from the performing physician.  

## 2012-12-27 NOTE — Op Note (Signed)
[facility] [address] [place], [address]   COLONOSCOPY PROCEDURE REPORT  PATIENT: Elizabeth, Gentry  MR#: [id number] BIRTHDATE: [birth year] , 51  yrs. old GENDER: Female ENDOSCOPIST: Mardella Layman, MD, Clement[facility] Graham REFERRED BY:  Birdie Sons, M.D. PROCEDURE DATE:  12/27/2012 PROCEDURE:   Colonoscopy with biopsy ASA CLASS:   Class II INDICATIONS:Average risk patient for colon cancer. MEDICATIONS: propofol (Diprivan) 400mg  IV  DESCRIPTION OF PROCEDURE:   After the risks and benefits and of the procedure were explained, informed consent was obtained.  A digital rectal exam revealed no abnormalities of the rectum.    The LB [id number]  endoscope was introduced through the anus and advanced to the cecum, which was identified by both the appendix and ileocecal valve .  The quality of the prep was good, using MoviPrep .  The instrument was then slowly withdrawn as the colon was fully examined.     COLON FINDINGS: A smooth sessile polyp ranging between 3-38mm in size was found at the hepatic flexure.Very difficult location at hepatic flexure turn.  A polypectomy was performed with cold forceps.  The resection was complete and the polyp tissue was completely retrieved.   The colon was otherwise normal.  There was no diverticulosis, inflammation, polyps or cancers unless previously stated.     Retroflexed views revealed no abnormalities.     The scope was then withdrawn from the patient and the procedure completed.  COMPLICATIONS: There were no complications. ENDOSCOPIC IMPRESSION: Sessile polyp ranging between 3-41mm in size was found at the hepatic flexure; polypectomy was performed with cold forceps .Marland KitchenMarland KitchenR/O adenoma...  RECOMMENDATIONS: 1.  Await biopsy results 2.  Repeat colonoscopy in 5 years if polyp adenomatous; otherwise 10 years   REPEAT EXAM:  cc:  _______________________________ eSignedMardella Layman, MD, [facility] 12/27/2012 3:17  PM     PATIENT NAME:  Elizabeth, Gentry MR#: [id number]

## 2012-12-27 NOTE — Progress Notes (Signed)
Patient did not experience any of the following events: a burn prior to discharge; a fall within the facility; wrong site/side/patient/procedure/implant event; or a hospital transfer or hospital admission upon discharge from the facility. (G8907) Patient did not have preoperative order for IV antibiotic SSI prophylaxis. (G8918)  

## 2012-12-27 NOTE — Patient Instructions (Addendum)

## 2012-12-28 ENCOUNTER — Telehealth: Payer: Self-pay

## 2012-12-28 NOTE — Telephone Encounter (Signed)
Left message on answering machine. 

## 2012-12-31 ENCOUNTER — Encounter: Payer: Self-pay | Admitting: Gastroenterology

## 2013-01-21 ENCOUNTER — Other Ambulatory Visit: Payer: Self-pay | Admitting: Internal Medicine

## 2013-02-07 ENCOUNTER — Telehealth: Payer: Self-pay | Admitting: Internal Medicine

## 2013-02-07 DIAGNOSIS — E785 Hyperlipidemia, unspecified: Secondary | ICD-10-CM

## 2013-02-07 NOTE — Telephone Encounter (Signed)
Orders placed, please schedule.

## 2013-02-07 NOTE — Telephone Encounter (Signed)
Patient called stating that she is supposed to come in and have her lipids rechecked. Please order.

## 2013-02-07 NOTE — Telephone Encounter (Signed)
appt scheduled

## 2013-02-09 ENCOUNTER — Other Ambulatory Visit: Payer: Managed Care, Other (non HMO)

## 2013-02-10 ENCOUNTER — Other Ambulatory Visit (INDEPENDENT_AMBULATORY_CARE_PROVIDER_SITE_OTHER): Payer: Managed Care, Other (non HMO)

## 2013-02-10 DIAGNOSIS — E785 Hyperlipidemia, unspecified: Secondary | ICD-10-CM

## 2013-02-10 LAB — LIPID PANEL
Cholesterol: 148 mg/dL (ref 0–200)
VLDL: 10.2 mg/dL (ref 0.0–40.0)

## 2013-02-10 LAB — HEPATIC FUNCTION PANEL
ALT: 14 U/L (ref 0–35)
Alkaline Phosphatase: 52 U/L (ref 39–117)
Bilirubin, Direct: 0.1 mg/dL (ref 0.0–0.3)
Total Bilirubin: 0.5 mg/dL (ref 0.3–1.2)
Total Protein: 7.1 g/dL (ref 6.0–8.3)

## 2013-02-14 ENCOUNTER — Encounter: Payer: Self-pay | Admitting: Internal Medicine

## 2013-04-20 ENCOUNTER — Other Ambulatory Visit (INDEPENDENT_AMBULATORY_CARE_PROVIDER_SITE_OTHER): Payer: Managed Care, Other (non HMO)

## 2013-04-20 DIAGNOSIS — E785 Hyperlipidemia, unspecified: Secondary | ICD-10-CM

## 2013-04-20 DIAGNOSIS — Z0000 Encounter for general adult medical examination without abnormal findings: Secondary | ICD-10-CM

## 2013-04-20 LAB — LIPID PANEL
HDL: 59.9 mg/dL (ref >39.00)
LDL Cholesterol: 74 mg/dL (ref 0–99)
Total CHOL/HDL Ratio: 3
Triglycerides: 91 mg/dL (ref 0.0–149.0)

## 2013-04-20 LAB — HEPATIC FUNCTION PANEL
AST: 17 U/L (ref 0–37)
Albumin: 3.5 g/dL (ref 3.5–5.2)
Total Bilirubin: 0.4 mg/dL (ref 0.3–1.2)

## 2013-04-23 ENCOUNTER — Encounter: Payer: Self-pay | Admitting: Internal Medicine

## 2013-06-15 ENCOUNTER — Other Ambulatory Visit: Payer: Self-pay | Admitting: Internal Medicine

## 2013-06-15 ENCOUNTER — Encounter: Payer: Self-pay | Admitting: Internal Medicine

## 2013-06-15 MED ORDER — ZOLPIDEM TARTRATE 5 MG PO TABS: 5.0000 mg | ORAL_TABLET | Freq: Every evening | ORAL | Status: DC | PRN

## 2013-06-15 NOTE — Addendum Note (Signed)
Addended by: Alfred Levins D on: 06/15/2013 03:55 PM   Modules accepted: Orders

## 2013-07-15 ENCOUNTER — Ambulatory Visit: Payer: Managed Care, Other (non HMO) | Admitting: Family

## 2013-07-20 ENCOUNTER — Other Ambulatory Visit: Payer: Self-pay | Admitting: Internal Medicine

## 2013-07-21 ENCOUNTER — Telehealth: Payer: Self-pay | Admitting: *Deleted

## 2013-07-21 NOTE — Telephone Encounter (Signed)
done

## 2013-11-03 ENCOUNTER — Other Ambulatory Visit: Payer: Self-pay | Admitting: Internal Medicine

## 2013-12-19 ENCOUNTER — Telehealth: Payer: Self-pay | Admitting: Internal Medicine

## 2013-12-19 ENCOUNTER — Other Ambulatory Visit: Payer: Self-pay | Admitting: Internal Medicine

## 2013-12-19 DIAGNOSIS — Z139 Encounter for screening, unspecified: Secondary | ICD-10-CM

## 2013-12-19 NOTE — Telephone Encounter (Signed)
Pt would like referral to have her hearing tested. Can she make her own appointment?

## 2013-12-21 NOTE — Telephone Encounter (Signed)
Referral order placed.

## 2014-01-14 ENCOUNTER — Other Ambulatory Visit: Payer: Self-pay | Admitting: Internal Medicine

## 2014-01-31 ENCOUNTER — Other Ambulatory Visit: Payer: Self-pay | Admitting: Internal Medicine

## 2014-02-22 ENCOUNTER — Other Ambulatory Visit (INDEPENDENT_AMBULATORY_CARE_PROVIDER_SITE_OTHER): Payer: PRIVATE HEALTH INSURANCE

## 2014-02-22 DIAGNOSIS — Z0000 Encounter for general adult medical examination without abnormal findings: Secondary | ICD-10-CM

## 2014-02-22 LAB — CBC WITH DIFFERENTIAL/PLATELET
BASOS ABS: 0 10*3/uL (ref 0.0–0.1)
Basophils Relative: 0.4 % (ref 0.0–3.0)
EOS PCT: 0.6 % (ref 0.0–5.0)
Eosinophils Absolute: 0 10*3/uL (ref 0.0–0.7)
HCT: 39.5 % (ref 36.0–46.0)
Hemoglobin: 13 g/dL (ref 12.0–15.0)
LYMPHS PCT: 32.1 % (ref 12.0–46.0)
Lymphs Abs: 2 10*3/uL (ref 0.7–4.0)
MCHC: 32.9 g/dL (ref 30.0–36.0)
MCV: 89.5 fl (ref 78.0–100.0)
MONOS PCT: 8.4 % (ref 3.0–12.0)
Monocytes Absolute: 0.5 10*3/uL (ref 0.1–1.0)
Neutro Abs: 3.6 10*3/uL (ref 1.4–7.7)
Neutrophils Relative %: 58.5 % (ref 43.0–77.0)
PLATELETS: 271 10*3/uL (ref 150.0–400.0)
RBC: 4.41 Mil/uL (ref 3.87–5.11)
RDW: 13.7 % (ref 11.5–14.6)
WBC: 6.1 10*3/uL (ref 4.5–10.5)

## 2014-02-22 LAB — BASIC METABOLIC PANEL
BUN: 18 mg/dL (ref 6–23)
CHLORIDE: 104 meq/L (ref 96–112)
CO2: 26 meq/L (ref 19–32)
Calcium: 9.1 mg/dL (ref 8.4–10.5)
Creatinine, Ser: 0.9 mg/dL (ref 0.4–1.2)
GFR: 74.71 mL/min (ref >60.00)
Glucose, Bld: 101 mg/dL — ABNORMAL HIGH (ref 70–99)
POTASSIUM: 4.2 meq/L (ref 3.5–5.1)
SODIUM: 137 meq/L (ref 135–145)

## 2014-02-22 LAB — POCT URINALYSIS DIPSTICK
BILIRUBIN UA: NEGATIVE
Glucose, UA: NEGATIVE
Ketones, UA: NEGATIVE
Leukocytes, UA: NEGATIVE
NITRITE UA: NEGATIVE
PH UA: 5.5
PROTEIN UA: NEGATIVE
Spec Grav, UA: 1.02
UROBILINOGEN UA: 0.2

## 2014-02-22 LAB — HEPATIC FUNCTION PANEL
ALBUMIN: 4.1 g/dL (ref 3.5–5.2)
ALK PHOS: 60 U/L (ref 39–117)
ALT: 15 U/L (ref 0–35)
AST: 16 U/L (ref 0–37)
Bilirubin, Direct: 0 mg/dL (ref 0.0–0.3)
Total Bilirubin: 0.6 mg/dL (ref 0.3–1.2)
Total Protein: 7.5 g/dL (ref 6.0–8.3)

## 2014-02-22 LAB — LIPID PANEL
CHOL/HDL RATIO: 2
Cholesterol: 123 mg/dL (ref 0–200)
HDL: 55.7 mg/dL (ref >39.00)
LDL CALC: 55 mg/dL (ref 0–99)
TRIGLYCERIDES: 64 mg/dL (ref 0.0–149.0)
VLDL: 12.8 mg/dL (ref 0.0–40.0)

## 2014-02-22 LAB — TSH: TSH: 8.92 u[IU]/mL — AB (ref 0.35–5.50)

## 2014-02-24 ENCOUNTER — Telehealth: Payer: Self-pay | Admitting: Internal Medicine

## 2014-02-24 MED ORDER — LEVOTHYROXINE SODIUM 75 MCG PO TABS: 75.0000 ug | ORAL_TABLET | Freq: Every day | ORAL | Status: DC

## 2014-02-24 NOTE — Telephone Encounter (Signed)
rx sent in electronically, pt notified via mychart

## 2014-02-24 NOTE — Telephone Encounter (Signed)
Pt said she received her tsh results and md would like to put her on levothyroxine 75 mcg #90. Pt said info was relay thru [employer]

## 2014-03-01 ENCOUNTER — Encounter: Payer: Self-pay | Admitting: Internal Medicine

## 2014-03-01 ENCOUNTER — Ambulatory Visit (INDEPENDENT_AMBULATORY_CARE_PROVIDER_SITE_OTHER): Payer: PRIVATE HEALTH INSURANCE | Admitting: Internal Medicine

## 2014-03-01 VITALS — BP 124/84 | HR 64 | Temp 98.1°F | Ht 70.0 in

## 2014-03-01 DIAGNOSIS — Z0000 Encounter for general adult medical examination without abnormal findings: Secondary | ICD-10-CM

## 2014-03-01 DIAGNOSIS — E039 Hypothyroidism, unspecified: Secondary | ICD-10-CM

## 2014-03-01 DIAGNOSIS — E785 Hyperlipidemia, unspecified: Secondary | ICD-10-CM

## 2014-03-01 NOTE — Progress Notes (Signed)
Pre visit review using our clinic review tool, if applicable. No additional management support is needed unless otherwise documented below in the visit note. 

## 2014-03-01 NOTE — Progress Notes (Signed)
cpx  Past Medical History  Diagnosis Date  . Depression   . Thyroid disease   . Hyperlipidemia     History   Social History  . Marital Status: Single    Spouse Name: N/A    Number of Children: N/A  . Years of Education: N/A   Occupational History  . Not on file.   Social History Main Topics  . Smoking status: Never Smoker   . Smokeless tobacco: Never Used  . Alcohol Use: 1.8 oz/week    3 Glasses of wine per week  . Drug Use: No  . Sexual Activity: Not on file   Other Topics Concern  . Not on file   Social History Narrative  . No narrative on file    Past Surgical History  Procedure Laterality Date  . Adenoidectomy    . Myringotomy    . Uterine ablation- 2010  2010    dr holland for AUB    Family History  Problem Relation Age of Onset  . Cancer Mother     breast  . Diabetes Mother   . Hypertension Mother   . Hyperlipidemia Mother   . Cancer Father     lung  . Colon polyps Father   . Cancer Maternal Grandmother 60    breast CA  . Colon cancer Paternal Uncle   . Colon cancer Paternal Grandmother     Allergies  Allergen Reactions  . Sulfamethoxazole Anaphylaxis    Current Outpatient Prescriptions on File Prior to Visit  Medication Sig Dispense Refill  . atorvastatin (LIPITOR) 20 MG tablet take 1 tablet by mouth once daily  90 tablet  0  . atorvastatin (LIPITOR) 20 MG tablet take 1 tablet by mouth once daily  90 tablet  0  . FLUoxetine (PROZAC) 10 MG capsule take 1 capsule by mouth once daily  30 capsule  5  . levothyroxine (SYNTHROID, LEVOTHROID) 75 MCG tablet Take 1 tablet (75 mcg total) by mouth daily.  90 tablet  3   No current facility-administered medications on file prior to visit.     patient denies chest pain, shortness of breath, orthopnea. Denies lower extremity edema, abdominal pain, change in appetite, change in bowel movements. Patient denies rashes, musculoskeletal complaints. No other specific complaints in a complete review of  systems.   BP 124/84  Pulse 64  Temp(Src) 98.1 F (36.7 C) (Oral)  Ht 5\' 10"  (1.778 m)  Well-developed well-nourished female in no acute distress. HEENT exam atraumatic, normocephalic, extraocular muscles are intact. Neck is supple. No jugular venous distention no thyromegaly. Chest clear to auscultation without increased work of breathing. Cardiac exam S1 and S2 are regular. Abdominal exam active bowel sounds, soft, nontender. Extremities no edema. Neurologic exam she is alert without any motor sensory deficits. Gait is normal.  Well visit- health maint uTD Weight is her main issue- needs to follow a low calorie diet  Note tsh- she will come back for recheck  Lipids- will try to minimize  Atorvastatin to 10 mg po qd

## 2014-03-02 ENCOUNTER — Other Ambulatory Visit: Payer: Self-pay

## 2014-03-02 ENCOUNTER — Encounter: Payer: Self-pay | Admitting: Internal Medicine

## 2014-03-02 DIAGNOSIS — Z1231 Encounter for screening mammogram for malignant neoplasm of breast: Secondary | ICD-10-CM

## 2014-03-02 DIAGNOSIS — Z803 Family history of malignant neoplasm of breast: Secondary | ICD-10-CM

## 2014-03-20 ENCOUNTER — Ambulatory Visit
Admission: RE | Admit: 2014-03-20 | Discharge: 2014-03-20 | Disposition: A | Discharge status: Home / Self Care | Payer: PRIVATE HEALTH INSURANCE | Source: Ambulatory Visit

## 2014-03-20 DIAGNOSIS — Z1231 Encounter for screening mammogram for malignant neoplasm of breast: Secondary | ICD-10-CM

## 2014-03-20 DIAGNOSIS — Z803 Family history of malignant neoplasm of breast: Secondary | ICD-10-CM

## 2014-05-12 ENCOUNTER — Encounter: Payer: Self-pay | Admitting: Internal Medicine

## 2014-05-12 DIAGNOSIS — R5381 Other malaise: Secondary | ICD-10-CM

## 2014-05-12 DIAGNOSIS — R5383 Other fatigue: Secondary | ICD-10-CM

## 2014-05-12 DIAGNOSIS — E785 Hyperlipidemia, unspecified: Secondary | ICD-10-CM

## 2014-05-25 ENCOUNTER — Other Ambulatory Visit (INDEPENDENT_AMBULATORY_CARE_PROVIDER_SITE_OTHER): Payer: PRIVATE HEALTH INSURANCE

## 2014-05-25 DIAGNOSIS — R5383 Other fatigue: Principal | ICD-10-CM

## 2014-05-25 DIAGNOSIS — R5381 Other malaise: Secondary | ICD-10-CM

## 2014-05-25 DIAGNOSIS — E785 Hyperlipidemia, unspecified: Secondary | ICD-10-CM

## 2014-05-25 LAB — T4, FREE: FREE T4: 0.82 ng/dL (ref 0.60–1.60)

## 2014-05-25 LAB — HEPATIC FUNCTION PANEL
ALK PHOS: 49 U/L (ref 39–117)
ALT: 13 U/L (ref 0–35)
AST: 18 U/L (ref 0–37)
Albumin: 3.9 g/dL (ref 3.5–5.2)
BILIRUBIN TOTAL: 0.2 mg/dL (ref 0.2–1.2)
Bilirubin, Direct: 0 mg/dL (ref 0.0–0.3)
Total Protein: 7 g/dL (ref 6.0–8.3)

## 2014-05-25 LAB — T3, FREE: T3 FREE: 2.2 pg/mL — AB (ref 2.3–4.2)

## 2014-06-28 ENCOUNTER — Other Ambulatory Visit: Payer: Self-pay | Admitting: Dermatology

## 2014-07-07 ENCOUNTER — Encounter: Payer: Self-pay | Admitting: Internal Medicine

## 2014-07-08 ENCOUNTER — Other Ambulatory Visit: Payer: Self-pay | Admitting: Internal Medicine

## 2014-09-22 ENCOUNTER — Other Ambulatory Visit: Payer: Self-pay

## 2014-12-04 ENCOUNTER — Ambulatory Visit (INDEPENDENT_AMBULATORY_CARE_PROVIDER_SITE_OTHER): Payer: PRIVATE HEALTH INSURANCE | Admitting: Physician Assistant

## 2014-12-04 ENCOUNTER — Encounter: Payer: Self-pay | Admitting: Physician Assistant

## 2014-12-04 ENCOUNTER — Telehealth: Payer: Self-pay | Admitting: Gastroenterology

## 2014-12-04 VITALS — BP 110/72 | HR 64 | Ht 70.0 in | Wt 264.2 lb

## 2014-12-04 DIAGNOSIS — Z86010 Personal history of colonic polyps: Secondary | ICD-10-CM

## 2014-12-04 DIAGNOSIS — K625 Hemorrhage of anus and rectum: Secondary | ICD-10-CM

## 2014-12-04 DIAGNOSIS — Z800 Family history of malignant neoplasm of digestive organs: Secondary | ICD-10-CM

## 2014-12-04 MED ORDER — MOVIPREP 100 G PO SOLR: 1.0000 | Freq: Once | ORAL | Status: DC

## 2014-12-04 NOTE — Patient Instructions (Signed)
You have been scheduled for a colonoscopy. Please follow written instructions given to you at your visit today.  Please pick up your prep kit at the pharmacy within the next 1-3 days. If you use inhalers (even only as needed), please bring them with you on the day of your procedure. Your physician has requested that you go to www.startemmi.com and enter the access code given to you at your visit today.( SENT TO YOUR E-MAIL) This web site gives a general overview about your procedure. However, you should still follow specific instructions given to you by our office regarding your preparation for the procedure.  

## 2014-12-04 NOTE — Progress Notes (Signed)
Reviewed and agree with management plan.  Verdean Murin T. Armon Orvis, MD FACG 

## 2014-12-04 NOTE — Telephone Encounter (Signed)
Spoke with patient and she has no preference for new GI MD. She has had rectal bleeding x 2 weeks. Denies constipation. States she can go for a walk and come back and wipe without bowel movement and she has bleeding. She has tried Tucks and Preparation H without relief. Scheduled with Nicoletta Ba, PA today at 10:30 AM.

## 2014-12-04 NOTE — Progress Notes (Signed)
Patient ID: Elizabeth Gentry, female   DOB: [birth year], 53 y.o.   MRN: [id number]   Subjective:    Patient ID: Elizabeth Gentry, female    DOB: 09-Aug-1961, 53 y.o.   MRN: [id number]  HPI Irys is a pleasant 53 year old white female previously known to Dr. Sharlett Iles and primary patient of Dr. Leanne Chang. She had undergone colonoscopy in January 2014 for screening. He was found to have one 5 mm polyp at the hepatic flexure and an otherwise negative exam no hemorrhoids noted. Polyp was a sessile serrated adenoma and she was scheduled for 3 year interval follow-up. She comes in today stating that she's been seeing bright red blood on the tissue over the past couple of weeks. He says she took a very long walk yesterday and had a small amount of seepage of blood. She has no complaints of abdominal discomfort or cramping no rectal pain. Says she may have had a little bit of rectal staining for couple of days. Her bowel movements have been normal she has not noted any blood mixed in with her bowel movements. He has been intermittently seeing the bright red blood on the tissue and occasionally a small amount of seepage of blood. She says the most blood that she has seen has been about the size of an Egg. Family history is pertinent for colon cancer in her paternal grandfather and multiple colon polyps in her father.  Review of Systems Pertinent positive and negative review of systems were noted in the above HPI section.  All other review of systems was otherwise negative.  Outpatient Encounter Prescriptions as of 12/04/2014  Medication Sig  . atorvastatin (LIPITOR) 20 MG tablet take 1 tablet by mouth once daily  . FLUoxetine (PROZAC) 10 MG capsule take 1 capsule by mouth once daily  . levothyroxine (SYNTHROID, LEVOTHROID) 75 MCG tablet Take 1 tablet (75 mcg total) by mouth daily.  . Multiple Vitamin (MULTIVITAMIN) tablet Take 1 tablet by mouth daily.  . Selenium 200 MCG CAPS Take 1 capsule by mouth daily.  Marland Kitchen MOVIPREP  100 G SOLR Take 1 kit (200 g total) by mouth once.   Allergies  Allergen Reactions  . Sulfamethoxazole Anaphylaxis   Patient Active Problem List   Diagnosis Date Noted  . Hyperlipidemia 03/01/2014  . Hypothyroid 06/10/2011  . OBESITY 06/01/2009  . DEPRESSION 06/01/2009   History   Social History  . Marital Status: Single    Spouse Name: N/A    Number of Children: N/A  . Years of Education: N/A   Occupational History  . Not on file.   Social History Main Topics  . Smoking status: Never Smoker   . Smokeless tobacco: Never Used  . Alcohol Use: 1.8 oz/week    3 Glasses of wine per week  . Drug Use: No  . Sexual Activity: Not on file   Other Topics Concern  . Not on file   Social History Narrative    Ms. Carrillo's family history includes Cancer in her father and mother; Cancer (age of onset: 71) in her maternal grandmother; Colon cancer in her paternal grandmother and paternal uncle; Colon polyps in her father; Diabetes in her mother; Hyperlipidemia in her mother; Hypertension in her mother.      Objective:    Filed Vitals:   12/04/14 1033  BP: 110/72  Pulse: 64    Physical Exam well-developed white female in no acute distress, pleasant vitals as above height 5 foot 10 weight 264 BMI 37. HEENT; nontraumatic normocephalic EOMI  PERRLA sclera anicteric, Supple; no JVD, Cardiovascular; regular rate and rhythm with S1-S2 no murmur or gallop, Pulmonary; clear bilaterally, Abdomen; soft nontender nondistended bowel sounds are active there is no palpable mass or hepatosplenomegaly bowel sounds are present, Rectal; exam there is no external lesions or hemorrhoids noted no appreciable fissure she is nontender to digital  exam on anoscopy I cannot appreciate an internal hemorrhoid ,she does have some prominent anal papillae, stool is brown and Hemoccult positive, Ext; no clubbing ,cyanosis ,or edema skin warm and dry, Psych; mood and affect appropriate     Assessment & Plan:     #1 53 yo female with bright red blood per rectum intermittently over the past 2 weeks primarily with blood on the tissue. Anoscopy is negative, I cannot appreciate an internal hemorrhoid as source for her bleeding or a fissure. She does have prominent anal papillae and this may be the etiology. Need to rule out more proximal source/occult lesion #2 personal history of sessile serrated adenoma on colonoscopy January 2014 due for follow-up January 2017 #3 positive family history of colon cancer in patient's grand father and multiple polyps in patient's father #4 obesity #5 Hypothyroid  Plan; discussion with patient regarding options. As no definite source seen on anoscopy feel she needs to have repeat colonoscopy. She will be scheduled for colonoscopy with Dr. Fuller Plan. Procedure discussed in detail with patient and she is agreeable to proceed.  Antaniya Venuti Genia Harold PA-C 12/04/2014

## 2014-12-08 HISTORY — PX: REDUCTION MAMMAPLASTY: SUR839

## 2014-12-21 ENCOUNTER — Encounter: Payer: Self-pay | Admitting: Gastroenterology

## 2014-12-21 ENCOUNTER — Ambulatory Visit (AMBULATORY_SURGERY_CENTER): Payer: PRIVATE HEALTH INSURANCE | Admitting: Gastroenterology

## 2014-12-21 VITALS — BP 139/71 | HR 42 | Temp 98.6°F | Resp 34 | Ht 70.0 in | Wt 264.0 lb

## 2014-12-21 DIAGNOSIS — Z860101 Personal history of adenomatous and serrated colon polyps: Secondary | ICD-10-CM

## 2014-12-21 DIAGNOSIS — Z86010 Personal history of colonic polyps: Secondary | ICD-10-CM | POA: Diagnosis not present

## 2014-12-21 DIAGNOSIS — D125 Benign neoplasm of sigmoid colon: Secondary | ICD-10-CM

## 2014-12-21 DIAGNOSIS — D128 Benign neoplasm of rectum: Secondary | ICD-10-CM | POA: Diagnosis not present

## 2014-12-21 DIAGNOSIS — K921 Melena: Secondary | ICD-10-CM

## 2014-12-21 DIAGNOSIS — D123 Benign neoplasm of transverse colon: Secondary | ICD-10-CM

## 2014-12-21 DIAGNOSIS — D122 Benign neoplasm of ascending colon: Secondary | ICD-10-CM

## 2014-12-21 DIAGNOSIS — D129 Benign neoplasm of anus and anal canal: Secondary | ICD-10-CM

## 2014-12-21 MED ORDER — SODIUM CHLORIDE 0.9 % IV SOLN: 500.0000 mL | INTRAVENOUS | Status: DC

## 2014-12-21 NOTE — Op Note (Signed)
[place]  [address] [place], [address]   COLONOSCOPY PROCEDURE REPORT PATIENT: Elizabeth, Gentry  MR#: [id number] BIRTHDATE: 04/15/1961 , 43  yrs. old GENDER: female ENDOSCOPIST: Ladene Artist, MD, [facility] REFERRED JO:ITGPQDI Tamala Julian, M.D. PROCEDURE DATE:  12/21/2014 PROCEDURE:   Colonoscopy with biopsy and Colonoscopy with snare polypectomy First Screening Colonoscopy - Avg.  risk and is 50 yrs.  old or older - No.  Prior Negative Screening - Now for repeat screening. N/A  History of Adenoma - Now for follow-up colonoscopy & has been > or = to 3 yrs.  No.  It has been less than 3 yrs since last colonoscopy.  Medical reason.  Polyps Removed Today? Yes. ASA CLASS:   Class II INDICATIONS:hematochezia and surveillance colonoscopy based on a history of adenomatous colonic polyp(s). MEDICATIONS: Monitored anesthesia care, Propofol 310 mg IV, and lidocaine 150 mg IV DESCRIPTION OF PROCEDURE:   After the risks benefits and alternatives of the procedure were thoroughly explained, informed consent was obtained.  The digital rectal exam revealed no abnormalities of the rectum.   The LB [id number]  endoscope was introduced through the anus and advanced to the cecum, which was identified by both the appendix and ileocecal valve. No adverse events experienced.   The quality of the prep was good, using MoviPrep  The instrument was then slowly withdrawn as the colon was fully examined.  COLON FINDINGS: A sessile polyp measuring 6 mm in size was found in the ascending colon. A polypectomy was performed with a cold snare. The resection was complete but the polyp tissue was not retrieved. Two sessile polyps measuring 5 mm in size were found in the rectum and transverse colon. Polypectomies were performed with cold forceps.  The resection was complete, the polyp tissue was completely retrieved and sent to histology. Two sessile polyps measuring 10-12 mm in size were found  in the rectum and sigmoid colon.  Polypectomies were performed using snare cautery. The resection was complete, the polyp tissue was completely retrieved and sent to histology. The examination was otherwise normal. Retroflexed views revealed internal Grade I hemorrhoids. The time to cecum=2 minutes 01 seconds.  Withdrawal time=15 minutes 41 seconds.  The scope was withdrawn and the procedure completed. COMPLICATIONS: There were no immediate complications.  ENDOSCOPIC IMPRESSION: 1.   Sessile polyp in the ascending colon; polypectomy performed with a cold snare 2.   Two sessile polyps in the rectum and transverse colon; polypectomies performed with cold forceps 3.   Two sessile polyps in the rectum and sigmoid colon; polypectomies performed using snare cautery 4.   Grade l internal hemorrhoids  RECOMMENDATIONS: 1.  Hold Aspirin and all other NSAIDS for 2 weeks. 2.  Await pathology results 3.  Repeat Colonoscopy in 3 years.  eSigned:  Ladene Artist, MD, [facility] 12/21/2014 3:57 PM

## 2014-12-21 NOTE — Patient Instructions (Signed)
Discharge instructions given. Handouts on polyps and hemorrhoids. Resume previous medications. Hold aspirin and all other NSAIDS for 2 weeks. YOU HAD AN ENDOSCOPIC PROCEDURE TODAY AT [facility]: Refer to the procedure report that was given to you for any specific questions about what was found during the examination.  If the procedure report does not answer your questions, please call your gastroenterologist to clarify.  If you requested that your care partner not be given the details of your procedure findings, then the procedure report has been included in a sealed envelope for you to review at your convenience later.  YOU SHOULD EXPECT: Some feelings of bloating in the abdomen. Passage of more gas than usual.  Walking can help get rid of the air that was put into your GI tract during the procedure and reduce the bloating. If you had a lower endoscopy (such as a colonoscopy or flexible sigmoidoscopy) you may notice spotting of blood in your stool or on the toilet paper. If you underwent a bowel prep for your procedure, then you may not have a normal bowel movement for a few days.  DIET: Your first meal following the procedure should be a light meal and then it is ok to progress to your normal diet.  A half-sandwich or bowl of soup is an example of a good first meal.  Heavy or fried foods are harder to digest and may make you feel nauseous or bloated.  Likewise meals heavy in dairy and vegetables can cause extra gas to form and this can also increase the bloating.  Drink plenty of fluids but you should avoid alcoholic beverages for 24 hours.  ACTIVITY: Your care partner should take you home directly after the procedure.  You should plan to take it easy, moving slowly for the rest of the day.  You can resume normal activity the day after the procedure however you should NOT DRIVE or use heavy machinery for 24 hours (because of the sedation medicines used during the test).    SYMPTOMS TO  REPORT IMMEDIATELY: A gastroenterologist can be reached at any hour.  During normal business hours, 8:30 AM to 5:00 PM Monday through Friday, call [phone].  After hours and on weekends, please call the GI answering service at [phone] who will take a message and have the physician on call contact you.   Following lower endoscopy (colonoscopy or flexible sigmoidoscopy):  Excessive amounts of blood in the stool  Significant tenderness or worsening of abdominal pains  Swelling of the abdomen that is new, acute  Fever of 100F or higher  FOLLOW UP: If any biopsies were taken you will be contacted by phone or by letter within the next 1-3 weeks.  Call your gastroenterologist if you have not heard about the biopsies in 3 weeks.  Our staff will call the home number listed on your records the next business day following your procedure to check on you and address any questions or concerns that you may have at that time regarding the information given to you following your procedure. This is a courtesy call and so if there is no answer at the home number and we have not heard from you through the emergency physician on call, we will assume that you have returned to your regular daily activities without incident.  SIGNATURES/CONFIDENTIALITY: You and/or your care partner have signed paperwork which will be entered into your electronic medical record.  These signatures attest to the fact that that the information  above on your After Visit Summary has been reviewed and is understood.  Full responsibility of the confidentiality of this discharge information lies with you and/or your care-partner. 

## 2014-12-21 NOTE — Progress Notes (Signed)
Called to room to assist during endoscopic procedure.  Patient ID and intended procedure confirmed with present staff. Received instructions for my participation in the procedure from the performing physician.  

## 2014-12-21 NOTE — Progress Notes (Signed)
Report to PACU, RN, vss, BBS= Clear.  

## 2014-12-22 ENCOUNTER — Telehealth: Payer: Self-pay | Admitting: *Deleted

## 2014-12-22 NOTE — Telephone Encounter (Signed)
No answer, left message to call if questions or concerns. 

## 2014-12-27 ENCOUNTER — Encounter: Payer: Self-pay | Admitting: Gastroenterology

## 2015-01-02 ENCOUNTER — Telehealth: Payer: Self-pay | Admitting: Gastroenterology

## 2015-01-02 NOTE — Telephone Encounter (Signed)
I have reviewed the results and the recommendations with the patient.  She will call back for any additional questions or concerns.  She never received her letter I have mailed her another copy

## 2015-02-01 ENCOUNTER — Other Ambulatory Visit: Payer: Self-pay

## 2015-02-01 DIAGNOSIS — Z1231 Encounter for screening mammogram for malignant neoplasm of breast: Secondary | ICD-10-CM

## 2015-04-03 ENCOUNTER — Other Ambulatory Visit: Payer: Self-pay | Admitting: Family Medicine

## 2015-04-03 ENCOUNTER — Ambulatory Visit
Admission: RE | Admit: 2015-04-03 | Discharge: 2015-04-03 | Disposition: A | Discharge status: Home / Self Care | Payer: PRIVATE HEALTH INSURANCE | Source: Ambulatory Visit | Attending: Family Medicine | Admitting: Family Medicine

## 2015-04-03 ENCOUNTER — Ambulatory Visit
Admission: RE | Admit: 2015-04-03 | Discharge: 2015-04-03 | Disposition: A | Discharge status: Home / Self Care | Payer: PRIVATE HEALTH INSURANCE | Source: Ambulatory Visit

## 2015-04-03 DIAGNOSIS — N6320 Unspecified lump in the left breast, unspecified quadrant: Secondary | ICD-10-CM

## 2015-04-03 DIAGNOSIS — Z1231 Encounter for screening mammogram for malignant neoplasm of breast: Secondary | ICD-10-CM

## 2015-04-23 ENCOUNTER — Other Ambulatory Visit (HOSPITAL_COMMUNITY)
Admission: RE | Admit: 2015-04-23 | Discharge: 2015-04-23 | Disposition: A | Discharge status: Home / Self Care | Payer: PRIVATE HEALTH INSURANCE | Source: Ambulatory Visit | Attending: Family Medicine | Admitting: Family Medicine

## 2015-04-23 ENCOUNTER — Other Ambulatory Visit: Payer: Self-pay | Admitting: Family Medicine

## 2015-04-23 DIAGNOSIS — Z01419 Encounter for gynecological examination (general) (routine) without abnormal findings: Secondary | ICD-10-CM | POA: Diagnosis not present

## 2015-04-24 LAB — CYTOLOGY - PAP

## 2015-08-09 ENCOUNTER — Other Ambulatory Visit: Payer: Self-pay

## 2016-02-25 ENCOUNTER — Other Ambulatory Visit: Payer: Self-pay

## 2016-02-25 DIAGNOSIS — Z1231 Encounter for screening mammogram for malignant neoplasm of breast: Secondary | ICD-10-CM

## 2016-04-03 ENCOUNTER — Ambulatory Visit
Admission: RE | Admit: 2016-04-03 | Discharge: 2016-04-03 | Disposition: A | Discharge status: Home / Self Care | Payer: 59 | Source: Ambulatory Visit

## 2016-04-03 DIAGNOSIS — Z1231 Encounter for screening mammogram for malignant neoplasm of breast: Secondary | ICD-10-CM

## 2016-10-27 ENCOUNTER — Other Ambulatory Visit: Payer: Self-pay | Admitting: Internal Medicine

## 2016-10-27 DIAGNOSIS — E039 Hypothyroidism, unspecified: Secondary | ICD-10-CM

## 2016-11-05 ENCOUNTER — Ambulatory Visit
Admission: RE | Admit: 2016-11-05 | Discharge: 2016-11-05 | Disposition: A | Discharge status: Home / Self Care | Payer: 59 | Source: Ambulatory Visit | Attending: Internal Medicine | Admitting: Internal Medicine

## 2016-11-05 DIAGNOSIS — E039 Hypothyroidism, unspecified: Secondary | ICD-10-CM

## 2017-02-17 ENCOUNTER — Other Ambulatory Visit: Payer: Self-pay | Admitting: Internal Medicine

## 2017-02-17 DIAGNOSIS — Z1231 Encounter for screening mammogram for malignant neoplasm of breast: Secondary | ICD-10-CM

## 2017-02-17 DIAGNOSIS — Z9889 Other specified postprocedural states: Secondary | ICD-10-CM

## 2017-04-06 ENCOUNTER — Ambulatory Visit
Admission: RE | Admit: 2017-04-06 | Discharge: 2017-04-06 | Disposition: A | Discharge status: Home / Self Care | Payer: 59 | Source: Ambulatory Visit | Attending: Internal Medicine | Admitting: Internal Medicine

## 2017-04-06 DIAGNOSIS — Z1231 Encounter for screening mammogram for malignant neoplasm of breast: Secondary | ICD-10-CM

## 2017-04-06 DIAGNOSIS — Z9889 Other specified postprocedural states: Secondary | ICD-10-CM

## 2017-04-07 ENCOUNTER — Other Ambulatory Visit: Payer: Self-pay | Admitting: Internal Medicine

## 2017-04-07 DIAGNOSIS — R928 Other abnormal and inconclusive findings on diagnostic imaging of breast: Secondary | ICD-10-CM

## 2017-04-10 ENCOUNTER — Ambulatory Visit
Admission: RE | Admit: 2017-04-10 | Discharge: 2017-04-10 | Disposition: A | Discharge status: Home / Self Care | Payer: 59 | Source: Ambulatory Visit | Attending: Internal Medicine | Admitting: Internal Medicine

## 2017-04-10 DIAGNOSIS — R928 Other abnormal and inconclusive findings on diagnostic imaging of breast: Secondary | ICD-10-CM

## 2017-10-26 ENCOUNTER — Encounter: Payer: Self-pay | Admitting: Gastroenterology

## 2017-11-26 IMAGING — MG 2D DIGITAL DIAGNOSTIC UNILATERAL LEFT MAMMOGRAM WITH CAD AND ADJ
6 series · 6 of 14 positions shown · non-contrast
Comparison: Previous exam(s).

ACR Breast Density Category a: The breast tissue is almost entirely
fatty.

CLINICAL DATA: Screening recall for a left breast asymmetry.

EXAM:
2D DIGITAL DIAGNOSTIC UNILATERAL LEFT MAMMOGRAM WITH CAD AND ADJUNCT
TOMO

[L MLO synth-2D]
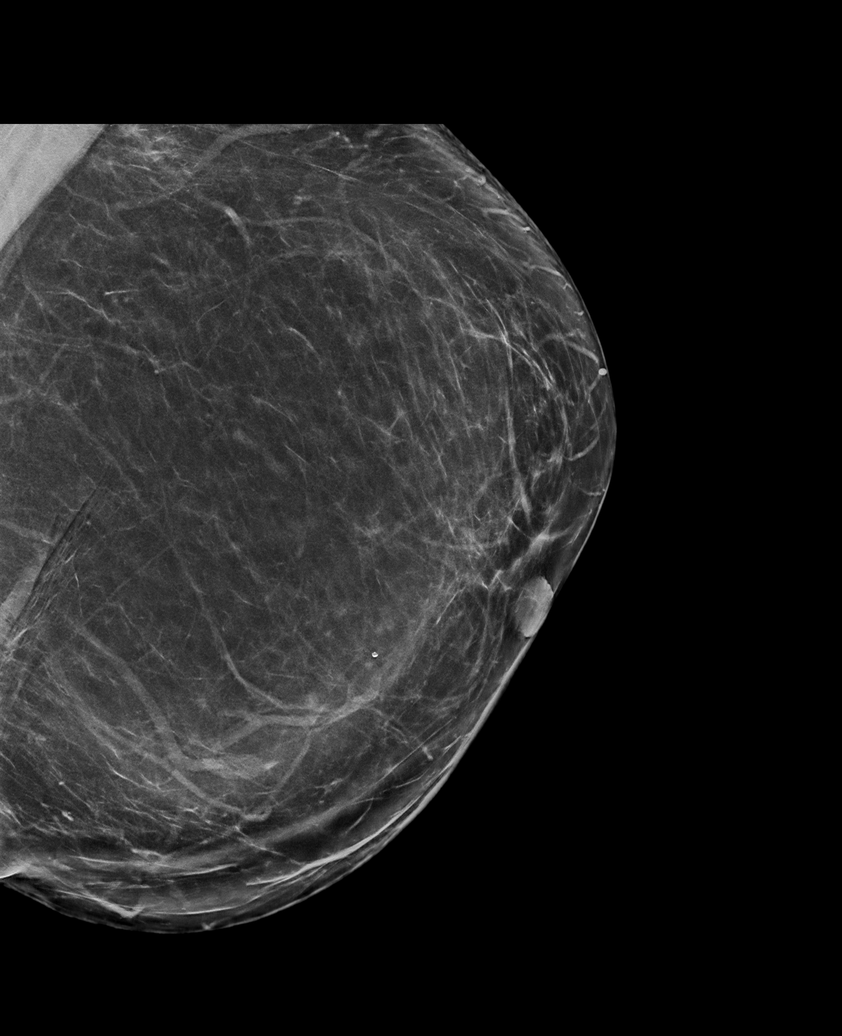

[L MLO]
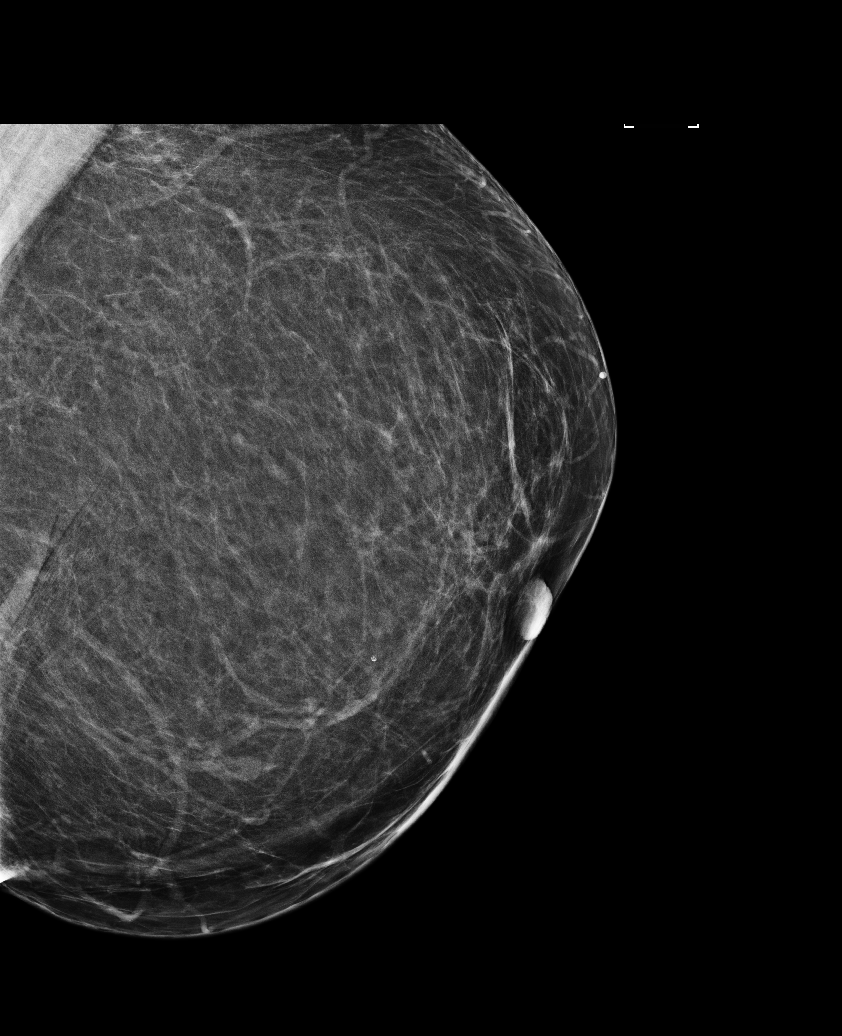

[L CC synth-2D]
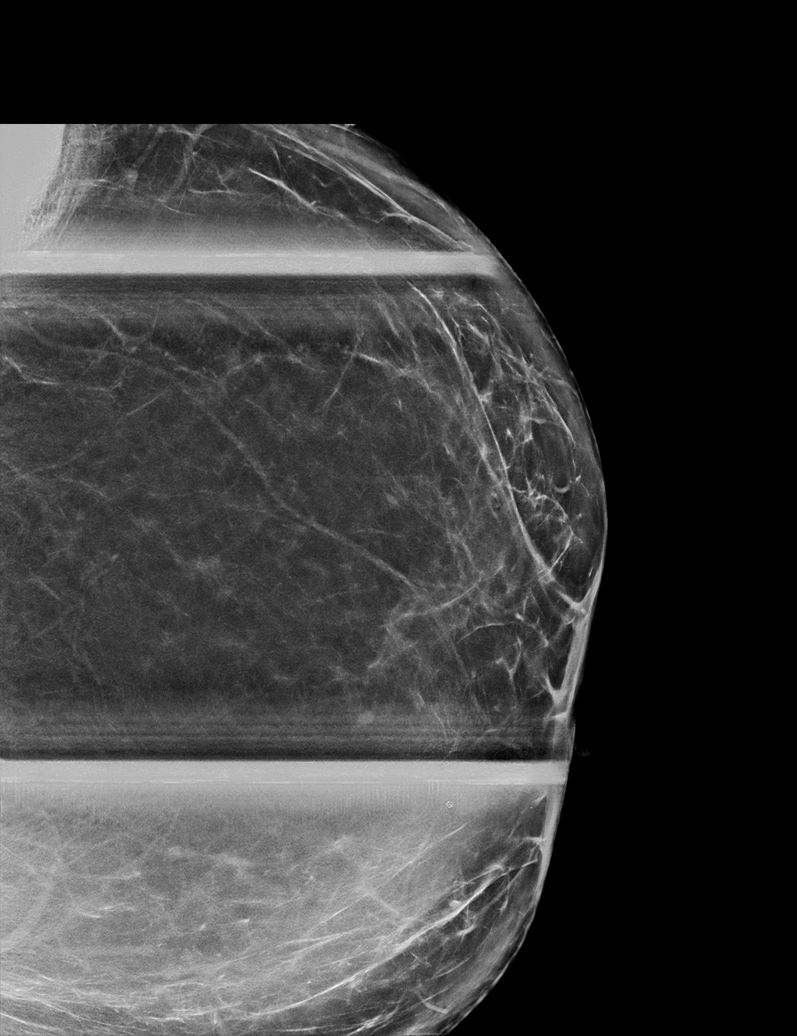

[L CC]
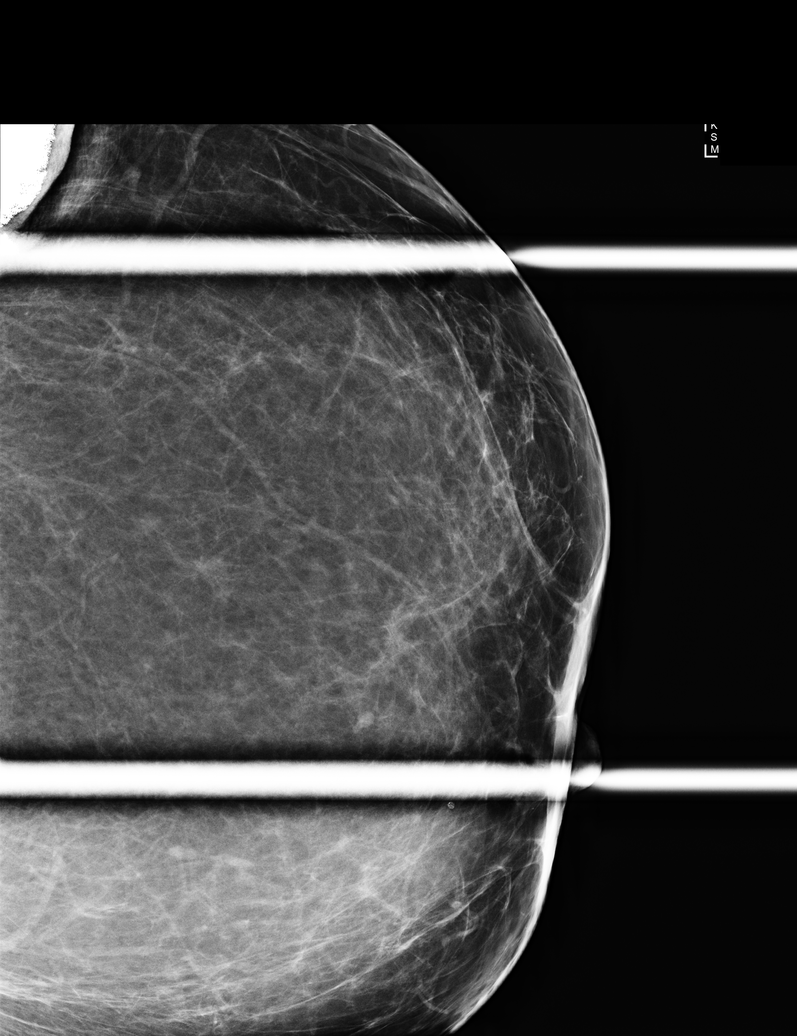

[L CC tomo · tomo slice 45/89.0]
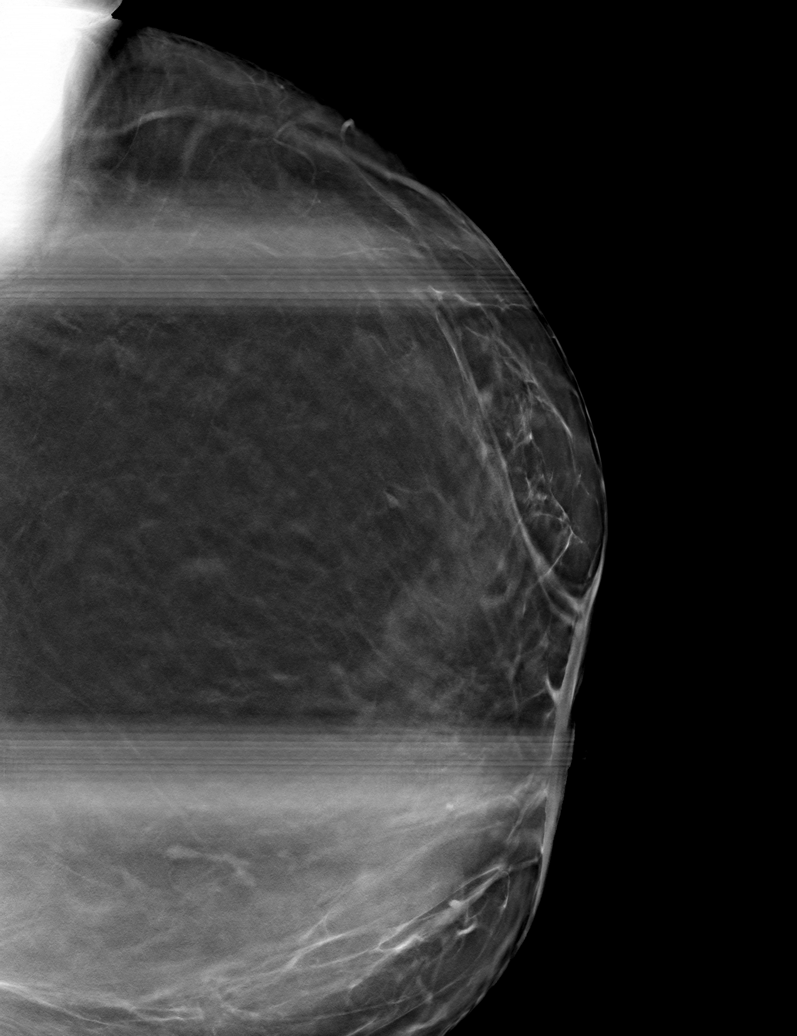

[L MLO tomo · tomo slice 46/91.0]
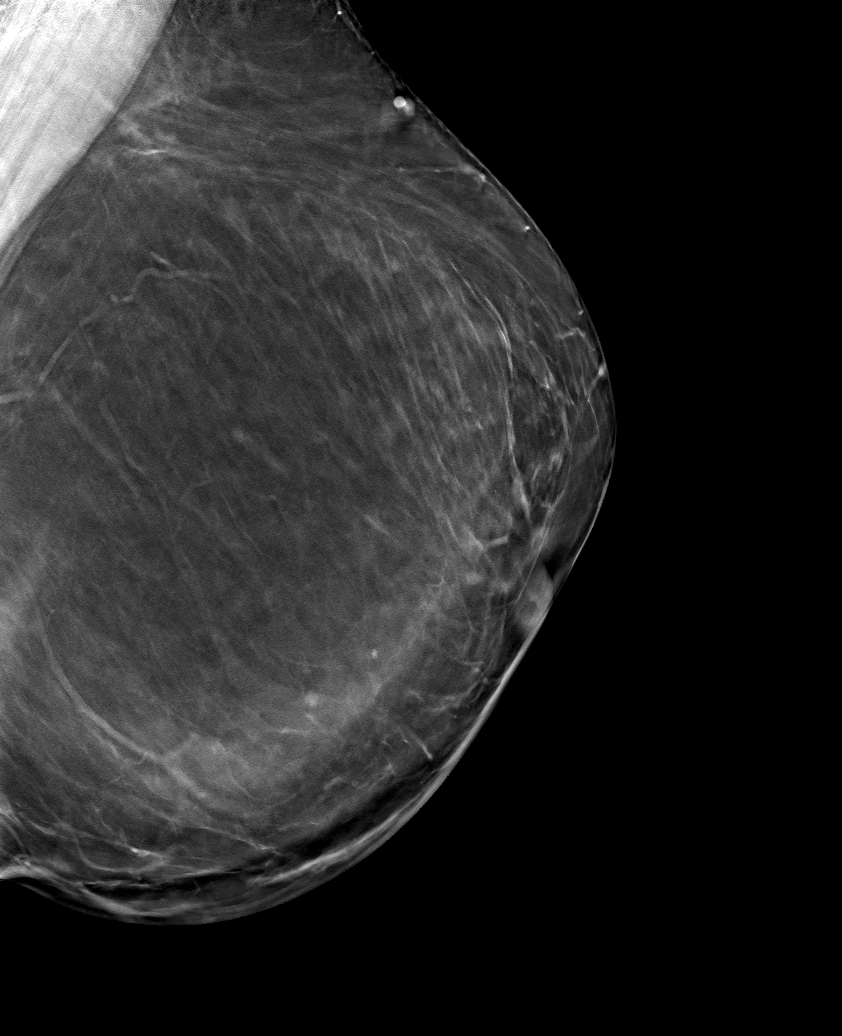

[6 of 14 positions shown; findings below may reference images not displayed]

FINDINGS: There is no mass or area of distortion identified on the additional
images of the left breast at the site of the asymmetry identified
mammographically. The small focus does not appear significantly
changed as compared to the mammogram in 0447.

Mammographic images were processed with CAD.
IMPRESSION: There is no persistent suspicious finding in the central left breast
at the site of the asymmetry identified on the screening mammogram.
This is compatible with the patient's normal overlapping
fibroglandular tissue.

RECOMMENDATION:
Screening mammogram in one year.(Code:P7-6-OPV)

I have discussed the findings and recommendations with the patient.
Results were also provided in writing at the conclusion of the
visit. If applicable, a reminder letter will be sent to the patient
regarding the next appointment.

BI-RADS CATEGORY  1: Negative.

## 2017-12-22 ENCOUNTER — Ambulatory Visit (AMBULATORY_SURGERY_CENTER): Payer: Self-pay

## 2017-12-22 ENCOUNTER — Other Ambulatory Visit: Payer: Self-pay

## 2017-12-22 VITALS — Ht 70.0 in | Wt 285.0 lb

## 2017-12-22 DIAGNOSIS — Z86010 Personal history of colonic polyps: Secondary | ICD-10-CM

## 2017-12-22 MED ORDER — NA SULFATE-K SULFATE-MG SULF 17.5-3.13-1.6 GM/177ML PO SOLN: 1.0000 | Freq: Once | ORAL | 0 refills | Status: AC

## 2017-12-22 NOTE — Progress Notes (Signed)
Denies allergies to eggs or soy products. Denies complication of anesthesia or sedation. Denies use of weight loss medication. Denies use of O2.   Emmi instructions declined.  

## 2017-12-31 ENCOUNTER — Encounter: Payer: Self-pay | Admitting: Gastroenterology

## 2018-01-05 ENCOUNTER — Ambulatory Visit (AMBULATORY_SURGERY_CENTER): Payer: 59 | Admitting: Gastroenterology

## 2018-01-05 ENCOUNTER — Other Ambulatory Visit: Payer: Self-pay

## 2018-01-05 ENCOUNTER — Encounter: Payer: Self-pay | Admitting: Gastroenterology

## 2018-01-05 VITALS — BP 131/68 | HR 55 | Temp 98.0°F | Resp 15 | Ht 70.0 in | Wt 285.0 lb

## 2018-01-05 DIAGNOSIS — Z86010 Personal history of colonic polyps: Secondary | ICD-10-CM | POA: Diagnosis not present

## 2018-01-05 DIAGNOSIS — D124 Benign neoplasm of descending colon: Secondary | ICD-10-CM

## 2018-01-05 DIAGNOSIS — D126 Benign neoplasm of colon, unspecified: Secondary | ICD-10-CM

## 2018-01-05 DIAGNOSIS — D125 Benign neoplasm of sigmoid colon: Secondary | ICD-10-CM

## 2018-01-05 DIAGNOSIS — K635 Polyp of colon: Secondary | ICD-10-CM | POA: Diagnosis not present

## 2018-01-05 MED ORDER — SODIUM CHLORIDE 0.9 % IV SOLN: 500.0000 mL | Freq: Once | INTRAVENOUS | Status: DC

## 2018-01-05 NOTE — Op Note (Signed)
[place] Patient Name: Elizabeth Gentry Procedure Date: 01/05/2018 11:34 AM MRN: [id number] Endoscopist: Ladene Artist , MD Age: 57 Referring MD:  Date of Birth: [birth year] Gender: Female Account #: [id number] Procedure:                Colonoscopy Indications:              Surveillance: Personal history of adenomatous                            polyps on last colonoscopy 5 years ago Medicines:                Monitored Anesthesia Care Procedure:                Pre-Anesthesia Assessment:                           - Prior to the procedure, a History and Physical                            was performed, and patient medications and                            allergies were reviewed. The patient's tolerance of                            previous anesthesia was also reviewed. The risks                            and benefits of the procedure and the sedation                            options and risks were discussed with the patient.                            All questions were answered, and informed consent                            was obtained. Prior Anticoagulants: The patient has                            taken no previous anticoagulant or antiplatelet                            agents. ASA Grade Assessment: II - A patient with                            mild systemic disease. After reviewing the risks                            and benefits, the patient was deemed in                            satisfactory condition to undergo the procedure.  After obtaining informed consent, the colonoscope                            was passed under direct vision. Throughout the                            procedure, the patient's blood pressure, pulse, and                            oxygen saturations were monitored continuously. The                            Colonoscope was introduced through the anus and                            advanced to the the  cecum, identified by                            appendiceal orifice and ileocecal valve. The                            ileocecal valve, appendiceal orifice, and rectum                            were photographed. The quality of the bowel                            preparation was excellent. The colonoscopy was                            performed without difficulty. The patient tolerated                            the procedure well. Scope In: 11:42:17 AM Scope Out: 11:54:27 AM Scope Withdrawal Time: 0 hours 10 minutes 2 seconds  Total Procedure Duration: 0 hours 12 minutes 10 seconds  Findings:                 The perianal and digital rectal examinations were                            normal.                           A 5 mm polyp was found in the descending colon. The                            polyp was sessile. The polyp was removed with a                            cold biopsy forceps. Resection and retrieval were                            complete.  A 7 mm polyp was found in the sigmoid colon. The                            polyp was sessile. The polyp was removed with a                            cold snare. Resection and retrieval were complete.                           Internal hemorrhoids were found during                            retroflexion. The hemorrhoids were small and Grade                            I (internal hemorrhoids that do not prolapse).                           The exam was otherwise without abnormality on                            direct and retroflexion views. Complications:            No immediate complications. Estimated blood loss:                            None. Estimated Blood Loss:     Estimated blood loss: none. Impression:               - One 5 mm polyp in the descending colon, removed                            with a cold biopsy forceps. Resected and retrieved.                           - One 7 mm polyp in the  sigmoid colon, removed with                            a cold snare. Resected and retrieved.                           - Internal hemorrhoids.                           - The examination was otherwise normal on direct                            and retroflexion views. Recommendation:           - Repeat colonoscopy in 5 years for surveillance.                           - Patient has a contact number available for  emergencies. The signs and symptoms of potential                            delayed complications were discussed with the                            patient. Return to normal activities tomorrow.                            Written discharge instructions were provided to the                            patient.                           - Resume previous diet.                           - Continue present medications.                           - Await pathology results. Ladene Artist, MD 01/05/2018 11:56:35 AM This report has been signed electronically.

## 2018-01-05 NOTE — Progress Notes (Signed)
Report to PACU, RN, vss, BBS= Clear.  

## 2018-01-05 NOTE — Progress Notes (Signed)
Pt's states no medical or surgical changes since previsit or office visit. 

## 2018-01-05 NOTE — Progress Notes (Signed)
Called to room to assist during endoscopic procedure.  Patient ID and intended procedure confirmed with present staff. Received instructions for my participation in the procedure from the performing physician.  

## 2018-01-05 NOTE — Patient Instructions (Signed)
YOU HAD AN ENDOSCOPIC PROCEDURE TODAY AT [facility]:   Refer to the procedure report that was given to you for any specific questions about what was found during the examination.  If the procedure report does not answer your questions, please call your gastroenterologist to clarify.  If you requested that your care partner not be given the details of your procedure findings, then the procedure report has been included in a sealed envelope for you to review at your convenience later.  YOU SHOULD EXPECT: Some feelings of bloating in the abdomen. Passage of more gas than usual.  Walking can help get rid of the air that was put into your GI tract during the procedure and reduce the bloating. If you had a lower endoscopy (such as a colonoscopy or flexible sigmoidoscopy) you may notice spotting of blood in your stool or on the toilet paper. If you underwent a bowel prep for your procedure, you may not have a normal bowel movement for a few days.  Please Note:  You might notice some irritation and congestion in your nose or some drainage.  This is from the oxygen used during your procedure.  There is no need for concern and it should clear up in a day or so.  SYMPTOMS TO REPORT IMMEDIATELY:   Following lower endoscopy (colonoscopy or flexible sigmoidoscopy):  Excessive amounts of blood in the stool  Significant tenderness or worsening of abdominal pains  Swelling of the abdomen that is new, acute  Fever of 100F or higher  For urgent or emergent issues, a gastroenterologist can be reached at any hour by calling [phone].   DIET:  We do recommend a small meal at first, but then you may proceed to your regular diet.  Drink plenty of fluids but you should avoid alcoholic beverages for 24 hours.  ACTIVITY:  You should plan to take it easy for the rest of today and you should NOT DRIVE or use heavy machinery until tomorrow (because of the sedation medicines used during the test).     FOLLOW UP: Our staff will call the number listed on your records the next business day following your procedure to check on you and address any questions or concerns that you may have regarding the information given to you following your procedure. If we do not reach you, we will leave a message.  However, if you are feeling well and you are not experiencing any problems, there is no need to return our call.  We will assume that you have returned to your regular daily activities without incident.  If any biopsies were taken you will be contacted by phone or by letter within the next 1-3 weeks.  Please call us at [phone] if you have not heard about the biopsies in 3 weeks.   Await for biopsy results to determine next repeat Colonoscopy Polyps (handout given) Hemorrhoids (handout given)  SIGNATURES/CONFIDENTIALITY: You and/or your care partner have signed paperwork which will be entered into your electronic medical record.  These signatures attest to the fact that that the information above on your After Visit Summary has been reviewed and is understood.  Full responsibility of the confidentiality of this discharge information lies with you and/or your care-partner.

## 2018-01-06 ENCOUNTER — Telehealth: Payer: Self-pay | Admitting: *Deleted

## 2018-01-06 NOTE — Telephone Encounter (Signed)
  Follow up Call-  Call back number 01/05/2018  Post procedure Call Back phone  # [phone]  Permission to leave phone message Yes  Some recent data might be hidden     Patient questions:  Do you have a fever, pain , or abdominal swelling? No. Pain Score  0 *  Have you tolerated food without any problems? Yes.    Have you been able to return to your normal activities? Yes.    Do you have any questions about your discharge instructions: Diet   No. Medications  No. Follow up visit  No.  Do you have questions or concerns about your Care? No.  Actions: * If pain score is 4 or above: No action needed, pain <4.

## 2018-01-06 NOTE — Telephone Encounter (Signed)
  Follow up Call-  Call back number 01/05/2018  Post procedure Call Back phone  # [phone]  Permission to leave phone message Yes  Some recent data might be hidden  no answer, left message to call if questions or concerns.

## 2018-01-18 ENCOUNTER — Other Ambulatory Visit (HOSPITAL_COMMUNITY)
Admission: RE | Admit: 2018-01-18 | Discharge: 2018-01-18 | Disposition: A | Discharge status: Home / Self Care | Payer: 59 | Source: Ambulatory Visit | Attending: Family Medicine | Admitting: Family Medicine

## 2018-01-18 ENCOUNTER — Other Ambulatory Visit: Payer: Self-pay | Admitting: Family Medicine

## 2018-01-18 DIAGNOSIS — Z124 Encounter for screening for malignant neoplasm of cervix: Secondary | ICD-10-CM | POA: Insufficient documentation

## 2018-01-20 LAB — CYTOLOGY - PAP
ADEQUACY: ABSENT
Diagnosis: NEGATIVE

## 2018-01-21 ENCOUNTER — Encounter: Payer: Self-pay | Admitting: Gastroenterology

## 2018-02-26 ENCOUNTER — Other Ambulatory Visit: Payer: Self-pay | Admitting: Family Medicine

## 2018-02-26 DIAGNOSIS — Z1231 Encounter for screening mammogram for malignant neoplasm of breast: Secondary | ICD-10-CM

## 2018-04-12 ENCOUNTER — Ambulatory Visit
Admission: RE | Admit: 2018-04-12 | Discharge: 2018-04-12 | Disposition: A | Discharge status: Home / Self Care | Payer: 59 | Source: Ambulatory Visit | Attending: Family Medicine | Admitting: Family Medicine

## 2018-04-12 DIAGNOSIS — Z1231 Encounter for screening mammogram for malignant neoplasm of breast: Secondary | ICD-10-CM

## 2019-04-20 ENCOUNTER — Other Ambulatory Visit: Payer: Self-pay | Admitting: Family Medicine

## 2019-04-20 DIAGNOSIS — Z1231 Encounter for screening mammogram for malignant neoplasm of breast: Secondary | ICD-10-CM

## 2019-06-09 ENCOUNTER — Ambulatory Visit
Admission: RE | Admit: 2019-06-09 | Discharge: 2019-06-09 | Disposition: A | Discharge status: Home / Self Care | Payer: 59 | Source: Ambulatory Visit | Attending: Family Medicine | Admitting: Family Medicine

## 2019-06-09 ENCOUNTER — Other Ambulatory Visit: Payer: Self-pay

## 2019-06-09 DIAGNOSIS — Z1231 Encounter for screening mammogram for malignant neoplasm of breast: Secondary | ICD-10-CM

## 2019-12-14 ENCOUNTER — Other Ambulatory Visit: Payer: Self-pay

## 2019-12-14 ENCOUNTER — Ambulatory Visit: Payer: 59 | Admitting: Podiatry

## 2019-12-14 VITALS — BP 103/61 | HR 53 | Temp 97.6°F

## 2019-12-14 DIAGNOSIS — L600 Ingrowing nail: Secondary | ICD-10-CM | POA: Diagnosis not present

## 2019-12-14 MED ORDER — NEOMYCIN-POLYMYXIN-HC 3.5-10000-1 OT SOLN: OTIC | 1 refills | Status: DC

## 2019-12-14 NOTE — Patient Instructions (Signed)

## 2019-12-19 NOTE — Progress Notes (Signed)
Subjective:   Patient ID: Elizabeth Gentry, female   DOB: 59 y.o.   MRN: [id number]   HPI Patient presents stating she has had chronic ingrown toenails of her big toes and they get sore and make it hard for her to wear shoe gear.  Patient states is a constant ache and shooting if they are touched and has not noted drainage or redness   Review of Systems  All other systems reviewed and are negative.       Objective:  Physical Exam Vitals and nursing note reviewed.  Constitutional:      Appearance: She is well-developed.  Pulmonary:     Effort: Pulmonary effort is normal.  Musculoskeletal:        General: Normal range of motion.  Skin:    General: Skin is warm.  Neurological:     Mental Status: She is alert.     Neurovascular status found to be intact muscle strength was found to be adequate range of motion within normal limits.  Patient is noted to have incurvated medial borders of the hallux bilateral that are painful when pressed and make shoe gear difficult.  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  I explained procedure risk and patient wants surgery and today I infiltrated each hallux 60 mg Xylocaine Marcaine mixture remove the medial borders exposed matrix and applied phenol 3 applications 30 seconds followed by alcohol lavage sterile dressing.  Give instructions on soaks leave dressing on 24 hours to take off earlier if any throbbing were to occur and wrote prescription for drops.  Encouraged to call with questions concerns

## 2020-02-26 NOTE — Progress Notes (Signed)
Cardiology Office Note:   Date:  02/27/2020  NAME:  Elizabeth Gentry    MRN: [id number] DOB:  Aug 08, 1961   PCP:  Carol Ada, MD  Cardiologist:  No primary care provider on file.   Referring MD: Carol Ada, MD   Chief Complaint  Patient presents with  . Bradycardia   History of Present Illness:   Elizabeth Gentry is a 59 y.o. female with a hx of hypothyroidism who is being seen today for the evaluation of bradycardia at the request of Carol Ada, MD.  She reports over the last 2 to 3 months she has noticed that her heart rate is low when she sleeps.  She does monitor her heart rate with an apple watch.  She is also had low blood pressures.  Blood pressure today is 90/72.  This is all coincided with undertreated hypothyroidism.  She reports her TSH has been climbing and her free T4 has been low.  Her primary care physician over the past few weeks has been increasing her dose of Synthroid.  Despite this she reports no symptoms.  She is extremely active highly athletic.  She reports that she recently went mountain biking and death [facility].  She was biking up to 47 miles per day without any limitations such as dizziness lightheadedness or shortness of breath.  She exercises daily doing intense cardio and aerobic activity.  She reports she can exercise for up to 2 hours/day without any symptoms of dizziness or lightheadedness.  She has no history of high blood pressure or diabetes.  Review of her lipid profile last year shows an LDL cholesterol 88.  She is on Lipitor.  It appears she is being treated for this due to her hypothyroidism problem hyperlipidemia.  She does have a family history of aortic valve disease.  Her mother had her aortic valve replaced in her 60s.  No prior echocardiogram in the system.  Past Medical History: Past Medical History:  Diagnosis Date  . Allergy   . Anxiety   . Depression   . Hashimoto's disease   . Hyperlipidemia   . Thyroid disease     Past  Surgical History: Past Surgical History:  Procedure Laterality Date  . ADENOIDECTOMY    . MYRINGOTOMY    . REDUCTION MAMMAPLASTY Bilateral 2016   Breast lift  . uterine ablation- 2010  2010   dr holland for AUB    Current Medications: Current Meds  Medication Sig  . atorvastatin (LIPITOR) 20 MG tablet take 1 tablet by mouth once daily  . cholecalciferol (VITAMIN D3) 25 MCG (1000 UT) tablet Take 1,000 Units by mouth daily.  Marland Kitchen FLUoxetine (PROZAC) 10 MG capsule take 1 capsule by mouth once daily  . levothyroxine (SYNTHROID) 100 MCG tablet Take 100 mcg by mouth every morning.  . Multiple Vitamin (MULTIVITAMIN) tablet Take 1 tablet by mouth daily.  . Selenium 200 MCG CAPS Take 1 capsule by mouth daily.   Current Facility-Administered Medications for the 02/27/20 encounter (Office Visit) with Geralynn Rile, MD  Medication  . 0.9 %  sodium chloride infusion     Allergies:    Sulfamethoxazole   Social History: Social History   Socioeconomic History  . Marital status: Single    Spouse name: Not on file  . Number of children: 0  . Years of education: Not on file  . Highest education level: Not on file  Occupational History  . Not on file  Tobacco Use  . Smoking status: Never Smoker  .  Smokeless tobacco: Never Used  Substance and Sexual Activity  . Alcohol use: Yes    Alcohol/week: 3.0 standard drinks    Types: 3 Glasses of wine per week  . Drug use: No  . Sexual activity: Not on file  Other Topics Concern  . Not on file  Social History Narrative  . Not on file   Social Determinants of Health   Financial Resource Strain:   . Difficulty of Paying Living Expenses:   Food Insecurity:   . Worried About Charity fundraiser in the Last Year:   . Arboriculturist in the Last Year:   Transportation Needs:   . Film/video editor (Medical):   Marland Kitchen Lack of Transportation (Non-Medical):   Physical Activity:   . Days of Exercise per Week:   . Minutes of Exercise per  Session:   Stress:   . Feeling of Stress :   Social Connections:   . Frequency of Communication with Friends and Family:   . Frequency of Social Gatherings with Friends and Family:   . Attends Religious Services:   . Active Member of Clubs or Organizations:   . Attends Archivist Meetings:   Marland Kitchen Marital Status:      Family History: The patient's family history includes Breast cancer (age of onset: 25) in her maternal grandmother; Breast cancer (age of onset: 33) in her mother; Cancer in her father and mother; Colon cancer in her maternal grandfather, paternal grandmother, and paternal uncle; Colon polyps in her father; Diabetes in her mother; Heart disease in her mother; Hyperlipidemia in her mother; Hypertension in her mother. There is no history of Stomach cancer, Esophageal cancer, Liver disease, Rectal cancer, or Pancreatic cancer.  ROS:   All other ROS reviewed and negative. Pertinent positives noted in the HPI.     EKGs/Labs/Other Studies Reviewed:   The following studies were personally reviewed by me today:  EKG:  EKG is ordered today.  The ekg ordered today demonstrates sinus bradycardia, heart rate 55, no acute ST-T changes, no evidence of prior infarction, and was personally reviewed by me.   Recent Labs: No results found for requested labs within last 8760 hours.   Recent Lipid Panel    Component Value Date/Time   CHOL 123 02/22/2014 0901   TRIG 64.0 02/22/2014 0901   HDL 55.70 02/22/2014 0901   CHOLHDL 2 02/22/2014 0901   VLDL 12.8 02/22/2014 0901   LDLCALC 55 02/22/2014 0901   LDLDIRECT 172.5 11/15/2012 0835    Physical Exam:   VS:  BP 90/72   Pulse (!) 55   Ht 5\' 10"  (1.778 m)   Wt 212 lb 12.8 oz (96.5 kg)   SpO2 98%   BMI 30.53 kg/m    Wt Readings from Last 3 Encounters:  02/27/20 212 lb 12.8 oz (96.5 kg)  01/05/18 285 lb (129.3 kg)  12/22/17 285 lb (129.3 kg)    General: Well nourished, well developed, in no acute distress Heart:  Atraumatic, normal size  Eyes: PEERLA, EOMI  Neck: Supple, no JVD Endocrine: No thryomegaly Cardiac: Normal S1, S2; RRR; no murmurs, rubs, or gallops Lungs: Clear to auscultation bilaterally, no wheezing, rhonchi or rales  Abd: Soft, nontender, no hepatomegaly  Ext: No edema, pulses 2+ Musculoskeletal: No deformities, BUE and BLE strength normal and equal Skin: Warm and dry, no rashes   Neuro: Alert and oriented to person, place, time, and situation, CNII-XII grossly intact, no focal deficits  Psych: Normal mood and  affect   ASSESSMENT:   Elizabeth Gentry is a 59 y.o. female who presents for the following: 1. Bradycardia     PLAN:   1. Bradycardia -Today EKG shows sinus bradycardia without any acute changes.  She has no symptoms from her bradycardia.  Her heart rate in the 30s while sleeping is not concerning.  She is an extremely athletic individual and this is partly to explain her low heart rate.  She is also has what I can gather from history is undertreated thyroid.  Her Synthroid dose has been increased steadily by her primary care physician and from what she tells me her TSH is still high.  She also reports that her free T4 is low.  I suspect her low heart rate as well as low blood pressure and low temperature are related to undertreated hypothyroidism.  She is waiting to see an endocrinologist.  I am more interested in her family history of aortic valve disease.  She reports that her mother had aortic valve replaced.  This could be senile aortic stenosis versus a bicuspid valve.  We will go ahead and proceed with an echocardiogram just to get a good look at the aortic valve.  I am really not concerned about her bradycardia we will make sure she does not have a bicuspid aortic valve.  We will see her on as-needed basis unless her echocardiogram is abnormal.  Disposition: Return if symptoms worsen or fail to improve.  Medication Adjustments/Labs and Tests Ordered: Current medicines are  reviewed at length with the patient today.  Concerns regarding medicines are outlined above.  Orders Placed This Encounter  Procedures  . EKG 12-Lead  . ECHOCARDIOGRAM COMPLETE   No orders of the defined types were placed in this encounter.   Patient Instructions  Medication Instructions:  The current medical regimen is effective;  continue present plan and medications.  *If you need a refill on your cardiac medications before your next appointment, please call your pharmacy*   Testing/Procedures: Echocardiogram - Your physician has requested that you have an echocardiogram. Echocardiography is a painless test that uses sound waves to create images of your heart. It provides your doctor with information about the size and shape of your heart and how well your heart's chambers and valves are working. This procedure takes approximately one hour. There are no restrictions for this procedure. This will be performed at our [facility] location - [address], [address].    Follow-Up: At [facility], you and your health needs are our priority.  As part of our continuing mission to provide you with exceptional heart care, we have created designated Provider Care Teams.  These Care Teams include your primary Cardiologist (physician) and Advanced Practice Providers (APPs -  Physician Assistants and Nurse Practitioners) who all work together to provide you with the care you need, when you need it.  We recommend signing up for the patient portal called "MyChart".  Sign up information is provided on this After Visit Summary.  MyChart is used to connect with patients for Virtual Visits (Telemedicine).  Patients are able to view lab/test results, encounter notes, upcoming appointments, etc.  Non-urgent messages can be sent to your provider as well.   To learn more about what you can do with MyChart, go to [email or web address].    Your next appointment:   PRN  The format for your next  appointment:   In Person  Provider:   Eleonore Chiquito, MD  Signed, Addison Naegeli. Audie Box, [place]  [address], [place], [place] [address] [phone]  02/27/2020 9:15 AM

## 2020-02-27 ENCOUNTER — Encounter: Payer: Self-pay | Admitting: Cardiovascular Disease

## 2020-02-27 ENCOUNTER — Other Ambulatory Visit: Payer: Self-pay

## 2020-02-27 ENCOUNTER — Ambulatory Visit: Payer: 59 | Admitting: Cardiovascular Disease

## 2020-02-27 VITALS — BP 90/72 | HR 55 | Ht 70.0 in | Wt 212.8 lb

## 2020-02-27 DIAGNOSIS — R001 Bradycardia, unspecified: Secondary | ICD-10-CM | POA: Diagnosis not present

## 2020-02-27 NOTE — Patient Instructions (Signed)
Medication Instructions:  The current medical regimen is effective;  continue present plan and medications.  *If you need a refill on your cardiac medications before your next appointment, please call your pharmacy*   Testing/Procedures: Echocardiogram - Your physician has requested that you have an echocardiogram. Echocardiography is a painless test that uses sound waves to create images of your heart. It provides your doctor with information about the size and shape of your heart and how well your heart's chambers and valves are working. This procedure takes approximately one hour. There are no restrictions for this procedure. This will be performed at our [facility] location - [address].    Follow-Up: At [facility], you and your health needs are our priority.  As part of our continuing mission to provide you with exceptional heart care, we have created designated Provider Care Teams.  These Care Teams include your primary Cardiologist (physician) and Advanced Practice Providers (APPs -  Physician Assistants and Nurse Practitioners) who all work together to provide you with the care you need, when you need it.  We recommend signing up for the patient portal called "MyChart".  Sign up information is provided on this After Visit Summary.  MyChart is used to connect with patients for Virtual Visits (Telemedicine).  Patients are able to view lab/test results, encounter notes, upcoming appointments, etc.  Non-urgent messages can be sent to your provider as well.   To learn more about what you can do with MyChart, go to [email or web address].    Your next appointment:   PRN  The format for your next appointment:   In Person  Provider:   Eleonore Chiquito, MD

## 2020-03-12 ENCOUNTER — Ambulatory Visit (HOSPITAL_COMMUNITY): Payer: 59 | Attending: Cardiovascular Disease

## 2020-03-12 ENCOUNTER — Other Ambulatory Visit: Payer: Self-pay

## 2020-03-12 DIAGNOSIS — R001 Bradycardia, unspecified: Secondary | ICD-10-CM | POA: Diagnosis not present

## 2020-05-23 ENCOUNTER — Other Ambulatory Visit: Payer: Self-pay | Admitting: Family Medicine

## 2020-05-23 DIAGNOSIS — Z1231 Encounter for screening mammogram for malignant neoplasm of breast: Secondary | ICD-10-CM

## 2020-06-14 ENCOUNTER — Ambulatory Visit
Admission: RE | Admit: 2020-06-14 | Discharge: 2020-06-14 | Disposition: A | Discharge status: Home / Self Care | Payer: 59 | Source: Ambulatory Visit | Attending: Family Medicine | Admitting: Family Medicine

## 2020-06-14 ENCOUNTER — Other Ambulatory Visit: Payer: Self-pay

## 2020-06-14 DIAGNOSIS — Z1231 Encounter for screening mammogram for malignant neoplasm of breast: Secondary | ICD-10-CM

## 2021-05-01 ENCOUNTER — Other Ambulatory Visit: Payer: Self-pay | Admitting: Family Medicine

## 2021-05-01 DIAGNOSIS — Z1231 Encounter for screening mammogram for malignant neoplasm of breast: Secondary | ICD-10-CM

## 2021-06-12 ENCOUNTER — Other Ambulatory Visit (HOSPITAL_COMMUNITY)
Admission: RE | Admit: 2021-06-12 | Discharge: 2021-06-12 | Disposition: A | Discharge status: Home / Self Care | Payer: 59 | Source: Ambulatory Visit | Attending: Family Medicine | Admitting: Family Medicine

## 2021-06-12 ENCOUNTER — Other Ambulatory Visit: Payer: Self-pay | Admitting: Family Medicine

## 2021-06-12 DIAGNOSIS — Z124 Encounter for screening for malignant neoplasm of cervix: Secondary | ICD-10-CM | POA: Diagnosis present

## 2021-06-18 LAB — CYTOLOGY - PAP
Comment: NEGATIVE
Diagnosis: NEGATIVE
High risk HPV: NEGATIVE

## 2021-06-26 ENCOUNTER — Ambulatory Visit
Admission: RE | Admit: 2021-06-26 | Discharge: 2021-06-26 | Disposition: A | Discharge status: Home / Self Care | Payer: 59 | Source: Ambulatory Visit

## 2021-06-26 ENCOUNTER — Other Ambulatory Visit: Payer: Self-pay

## 2021-06-26 DIAGNOSIS — Z1231 Encounter for screening mammogram for malignant neoplasm of breast: Secondary | ICD-10-CM

## 2021-06-28 ENCOUNTER — Other Ambulatory Visit: Payer: Self-pay | Admitting: Family Medicine

## 2021-06-28 DIAGNOSIS — E2839 Other primary ovarian failure: Secondary | ICD-10-CM

## 2021-07-04 ENCOUNTER — Ambulatory Visit
Admission: RE | Admit: 2021-07-04 | Discharge: 2021-07-04 | Disposition: A | Discharge status: Home / Self Care | Payer: 59 | Source: Ambulatory Visit | Attending: Family Medicine | Admitting: Family Medicine

## 2021-07-04 ENCOUNTER — Other Ambulatory Visit: Payer: Self-pay

## 2021-07-04 DIAGNOSIS — E2839 Other primary ovarian failure: Secondary | ICD-10-CM

## 2022-05-26 ENCOUNTER — Other Ambulatory Visit: Payer: Self-pay | Admitting: Family Medicine

## 2022-05-26 DIAGNOSIS — Z1231 Encounter for screening mammogram for malignant neoplasm of breast: Secondary | ICD-10-CM

## 2022-06-30 ENCOUNTER — Ambulatory Visit
Admission: RE | Admit: 2022-06-30 | Discharge: 2022-06-30 | Disposition: A | Discharge status: Home / Self Care | Payer: 59 | Source: Ambulatory Visit | Attending: Family Medicine | Admitting: Family Medicine

## 2022-06-30 DIAGNOSIS — Z1231 Encounter for screening mammogram for malignant neoplasm of breast: Secondary | ICD-10-CM

## 2022-07-29 ENCOUNTER — Ambulatory Visit (INDEPENDENT_AMBULATORY_CARE_PROVIDER_SITE_OTHER): Payer: 59

## 2022-07-29 ENCOUNTER — Ambulatory Visit (INDEPENDENT_AMBULATORY_CARE_PROVIDER_SITE_OTHER): Payer: 59 | Admitting: Orthopaedic Surgery

## 2022-07-29 ENCOUNTER — Encounter: Payer: Self-pay | Admitting: Orthopaedic Surgery

## 2022-07-29 DIAGNOSIS — M25571 Pain in right ankle and joints of right foot: Secondary | ICD-10-CM

## 2022-07-29 NOTE — Progress Notes (Signed)
Office Visit Note   Patient: Elizabeth Gentry           Date of Birth: [birth year]           MRN: [id number] Visit Date: 07/29/2022              Requested by: Carol Ada, MD [place],  [place] [id number] PCP: Carol Ada, MD   Assessment & Plan: Visit Diagnoses:  1. Pain in right ankle and joints of right foot     Plan: Impression is planter fasciitis and peroneal tendinitis.  She has evidence of ankle arthrosis as well.  Treatment options were reviewed and she would like to start with stretches and she is interested in outpatient PT.  She will pick up a neoprene ankle support for now.  She will look at finding shoes with more arch support.  Questions encouraged and answered.  Follow-up as needed.  Follow-Up Instructions: No follow-ups on file.   Orders:  Orders Placed This Encounter  Procedures   XR Foot Complete Right   Ambulatory referral to Physical Therapy   No orders of the defined types were placed in this encounter.     Procedures: No procedures performed   Clinical Data: No additional findings.   Subjective: Chief Complaint  Patient presents with   Right Foot - Pain    HPI Elizabeth Gentry is a 61 year old female sister only and older so comes in for right foot pain for about 2 months.  Mainly has heel pain directly centrally is worse in the morning.  Occasionally some burning pain on the anterior and lateral ankle.  Feels unstable when walking on gravel.  No prior foot surgery.  Not diabetic.  Feels like she has to alter her gait due to the pain. Review of Systems  Constitutional: Negative.   HENT: Negative.    Eyes: Negative.   Respiratory: Negative.    Cardiovascular: Negative.   Endocrine: Negative.   Musculoskeletal: Negative.   Neurological: Negative.   Hematological: Negative.   Psychiatric/Behavioral: Negative.    All other systems reviewed and are negative.    Objective: Vital Signs: There were no vitals taken for  this visit.  Physical Exam Vitals and nursing note reviewed.  Constitutional:      Appearance: She is well-developed.  HENT:     Head: Atraumatic.     Nose: Nose normal.  Eyes:     Extraocular Movements: Extraocular movements intact.  Cardiovascular:     Pulses: Normal pulses.  Pulmonary:     Effort: Pulmonary effort is normal.  Abdominal:     Palpations: Abdomen is soft.  Musculoskeletal:     Cervical back: Neck supple.  Skin:    General: Skin is warm.     Capillary Refill: Capillary refill takes less than 2 seconds.  Neurological:     Mental Status: She is alert. Mental status is at baseline.  Psychiatric:        Behavior: Behavior normal.        Thought Content: Thought content normal.        Judgment: Judgment normal.     Ortho Exam Lamination of the right foot shows tenderness to the insertion of the plantar fascia.  Tenderness along the peroneal tendons.  Mild hindfoot valgus.  Flexible flatfoot. Specialty Comments:  No specialty comments available.  Imaging: XR Foot Complete Right  Result Date: 07/29/2022 Moderate degenerative changes to the ankle joint with spurring of the talar head and navicular.  Plantar calcaneal spur consistent with chronic planter fasciitis.    PMFS History: Patient Active Problem List   Diagnosis Date Noted   Hyperlipidemia 03/01/2014   Hypothyroid 06/10/2011   OBESITY 06/01/2009   DEPRESSION 06/01/2009   Past Medical History:  Diagnosis Date   Allergy    Anxiety    Depression    Hashimoto's disease    Hyperlipidemia    Thyroid disease     Family History  Problem Relation Age of Onset   Cancer Mother        breast   Diabetes Mother    Hypertension Mother    Hyperlipidemia Mother    Breast cancer Mother 5   Heart disease Mother    Cancer Father        lung   Colon polyps Father    Colon cancer Paternal Uncle    Colon cancer Paternal Grandmother    Breast cancer Maternal Grandmother 70   Colon cancer Maternal  Grandfather    Stomach cancer Neg Hx    Esophageal cancer Neg Hx    Liver disease Neg Hx    Rectal cancer Neg Hx    Pancreatic cancer Neg Hx     Past Surgical History:  Procedure Laterality Date   ADENOIDECTOMY     MYRINGOTOMY     REDUCTION MAMMAPLASTY Bilateral 2016   Breast lift   uterine ablation- 2010  2010   dr holland for AUB   Social History   Occupational History   Not on file  Tobacco Use   Smoking status: Never   Smokeless tobacco: Never  Substance and Sexual Activity   Alcohol use: Yes    Alcohol/week: 3.0 standard drinks of alcohol    Types: 3 Glasses of wine per week   Drug use: No   Sexual activity: Not on file

## 2022-08-13 ENCOUNTER — Ambulatory Visit: Payer: 59 | Admitting: Rehabilitative and Restorative Service Providers"

## 2022-08-21 ENCOUNTER — Ambulatory Visit: Payer: 59 | Admitting: Physical Therapy

## 2022-09-01 ENCOUNTER — Encounter: Payer: Self-pay | Admitting: Physical Therapy

## 2022-09-01 ENCOUNTER — Ambulatory Visit (INDEPENDENT_AMBULATORY_CARE_PROVIDER_SITE_OTHER): Payer: 59 | Admitting: Physical Therapy

## 2022-09-01 DIAGNOSIS — M25671 Stiffness of right ankle, not elsewhere classified: Secondary | ICD-10-CM

## 2022-09-01 DIAGNOSIS — R262 Difficulty in walking, not elsewhere classified: Secondary | ICD-10-CM | POA: Diagnosis not present

## 2022-09-01 NOTE — Therapy (Addendum)
OUTPATIENT PHYSICAL THERAPY LOWER EXTREMITY EVALUATION/DISCHARGE   Patient Name: Elizabeth Gentry MRN: [id number] DOB:Apr 05, 1961, 61 y.o., female Today's Date: 09/01/2022  PHYSICAL THERAPY DISCHARGE SUMMARY  Visits from Start of Care: 1  Current functional level related to goals / functional outcomes: See evaluation   Remaining deficits: See evaluation   Education / Equipment: HEP started   Patient agrees to discharge. Patient goals were  established and this patient never returned for follow-up . Patient is being discharged due to not returning since the last visit.    PT End of Session - 09/01/22 1241     Visit Number 1    Number of Visits 4    Date for PT Re-Evaluation 11/28/22    PT Start Time 0930    PT Stop Time 1015    PT Time Calculation (min) 45 min    Activity Tolerance Patient tolerated treatment well    Behavior During Therapy [facility] for tasks assessed/performed             Past Medical History:  Diagnosis Date   Allergy    Anxiety    Depression    Hashimoto's disease    Hyperlipidemia    Thyroid disease    Past Surgical History:  Procedure Laterality Date   ADENOIDECTOMY     MYRINGOTOMY     REDUCTION MAMMAPLASTY Bilateral 2016   Breast lift   uterine ablation- 2010  2010   dr holland for AUB   Patient Active Problem List   Diagnosis Date Noted   Hyperlipidemia 03/01/2014   Hypothyroid 06/10/2011   OBESITY 06/01/2009   DEPRESSION 06/01/2009    PCP: Carol Ada, MD  REFERRING PROVIDER: Leandrew Koyanagi, MD  REFERRING DIAG: M25.571, pain in right ankle and joint of right foot  THERAPY DIAG:  Stiffness of right ankle, not elsewhere classified - Plan: PT plan of care cert/re-cert  Difficulty in walking, not elsewhere classified - Plan: PT plan of care cert/re-cert  Rationale for Evaluation and Treatment Rehabilitation  ONSET DATE: last few months  SUBJECTIVE:    SUBJECTIVE STATEMENT: Pt arriving stating since she saw Dr. Erlinda Hong, she  has changed her shoes and started changing her sleeping position and wearing a brace by body helix and her pain has significantly decreased.   PERTINENT HISTORY: Plantar fascitis, pes planus, peroneal tendinitis, Allergies, anxiety, depression, Hashimoto's disease, thyroid disease  PAIN:  NPRS scale: 0/10 at present   PRECAUTIONS: None  WEIGHT BEARING RESTRICTIONS No  FALLS:  Has patient fallen in last 6 months? 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  Plantar calcaneal spur consistent with chronic  planter fasciitis.  PATIENT SURVEYS:  09/01/22:  FOTO intake:  99%  predicted:  93%  COGNITION:  09/01/22:Overall cognitive status: WFL      POSTURE:  09/01/22: forward head and shoulders  PALPATION: 09/01/22: mild tenderness and tightness noted in plantar fascia and at calcaneal tubercle   LOWER EXTREMITY ROM:  Passive ROM Right 09/01/22 Left 09/01/22  Hip flexion    Hip extension    Hip abduction    Hip adduction  Hip internal rotation    Hip external rotation    Knee flexion    Knee extension    Ankle dorsiflexion 8 15  Ankle plantarflexion 25 30  Ankle inversion 28 36  Ankle eversion 40 42   (Blank rows = not tested)  LOWER EXTREMITY MMT:  MMT Right 09/01/22 Left 09/01/22  Hip flexion    Hip extension    Hip abduction    Hip adduction    Hip internal rotation    Hip external rotation    Knee flexion    Knee extension    Ankle dorsiflexion 5/5 5/5  Ankle plantarflexion 5/5 5/5  Ankle inversion 4/5 5/5  Ankle eversion 4/5 5/5   (Blank  rows = not tested)    FUNCTIONAL TESTS:  09/01/22:  Sit to stand x 5 : 10 seconds SLS: Rt LE: 5 seconds with increases sway   GAIT: Distance walked: 30 feet Assistive device utilized: None Level of assistance: Complete Independence Comments: mild decreased DF and mild pronation   TODAY'S TREATMENT: Therex:    HEP instruction/performance c cues for techniques, handout provided.  Trial set performed of each for comprehension and symptom assessment.  See below for exercise list    PATIENT EDUCATION:  Education details: HEP, POC Person educated: Patient Education method: Explanation, Demonstration, Verbal cues, and Handouts Education comprehension: verbalized understanding, returned demonstration, and verbal cues required    HOME EXERCISE PROGRAM: Access Code: [email or web address] URL: [email or web address] Date: 09/01/2022 Prepared by: Kearney Hard  Exercises - Seated Ankle Alphabet  - 1 x daily - 7 x weekly - 1 reps - Standing Gastroc Stretch on Foam 1/2 Roll  - 1 x daily - 7 x weekly - 3 reps - 30 seconds hold - Ankle Dorsiflexion with Resistance  - 1 x daily - 7 x weekly - 2 sets - 10 reps - Ankle and Toe Plantarflexion with Resistance  - 1 x daily - 7 x weekly - 2 sets - 10 reps - Ankle Eversion with Resistance  - 1 x daily - 7 x weekly - 2 sets - 10 reps - Ankle Inversion with Resistance  - 1 x daily - 7 x weekly - 2 sets - 10 reps - Standing Heel Raise with Toes Turned Out  - 1 x daily - 7 x weekly - 10 reps - Standing Heel Raise with Toes Turned In  - 1 x daily - 7 x weekly - 10 reps  ASSESSMENT:  CLINICAL IMPRESSION: Patient is a 61 y.o. who comes to clinic with complaints of Rt foot and right ankle pain at times with dx of plantar fascitis, pes planus and peroneal tendinitis with mobility, strength and movement coordination deficits that impair their ability to perform usual daily and recreational functional activities without increase difficulty/symptoms  at this time.  Patient to benefit from skilled PT services to address impairments and limitations to improve to previous level of function without restriction secondary to condition.     OBJECTIVE IMPAIRMENTS decreased balance, difficulty walking, decreased ROM, and decreased strength. pain  ACTIVITY LIMITATIONS standing and squatting  PARTICIPATION LIMITATIONS: community activity  PERSONAL FACTORS Plantar fascitis, pes planus, peroneal tendinitis, Allergies, anxiety, depression, Hashimoto's disease, thyroid disease are also affecting patient's functional outcome.   REHAB POTENTIAL: Excellent  CLINICAL DECISION MAKING: Stable/uncomplicated  EVALUATION COMPLEXITY: Low   GOALS: Goals reviewed with patient? Yes  Short term PT Goals (target date for Short term goals are 3 weeks 09/26/22) Patient will demonstrate independent use of home exercise  program to maintain progress from in clinic treatments. Goal status: New   Long term PT goals (target dates: 12 weeks: 11/28/22)   1. Patient will improve her Rt DF to >/= 15 degrees for improved gait mechanics.  Goal status: New   2. Patient will demonstrate independent use of advanced home exercise program to facilitate ability to maintain/progress functional gains from skilled physical therapy services. Goal status: New   3. Patient will demonstrate FOTO outcome > or = >93 % to indicate reduced disability due to condition. Goal status: New   4.  Patient will demonstrate right ankle strength of  5/5 throughout to faciltiate usual transfers, stairs, squatting at [facility] for daily life.   Goal status: New  5. Pt will be able to perform Rt SLS >/ 10 seconds for improved dynamic balance.   A. Goal status: New    PLAN:  PT FREQUENCY: 1/week every 3-4 weeks  PT DURATION:  12 weeks  PLANNED INTERVENTIONS: Therapeutic exercises, Therapeutic activity, Neuro Muscular re-education, Balance training, Gait training, Patient/Family education,  Joint mobilization, Stair training, DME instructions, Dry Needling, Electrical stimulation, Traction, Cryotherapy, Moist heat, Taping, Ultrasound, Ionotophoresis '4mg'$ /ml Dexamethasone, and Manual therapy.  All included unless contraindicated   PLAN FOR NEXT SESSION: Review HEP knowledge/results.        Oretha Caprice, PT, MPT 09/01/2022, 1:02 PM  Farley Ly PT, MPT

## 2022-09-23 ENCOUNTER — Encounter: Payer: 59 | Admitting: Physical Therapy

## 2022-11-03 ENCOUNTER — Encounter: Payer: Self-pay | Admitting: Gastroenterology

## 2022-12-18 ENCOUNTER — Ambulatory Visit (AMBULATORY_SURGERY_CENTER): Payer: 59 | Admitting: *Deleted

## 2022-12-18 VITALS — Ht 70.0 in | Wt 275.0 lb

## 2022-12-18 DIAGNOSIS — Z86010 Personal history of colonic polyps: Secondary | ICD-10-CM

## 2022-12-18 MED ORDER — NA SULFATE-K SULFATE-MG SULF 17.5-3.13-1.6 GM/177ML PO SOLN: 1.0000 | Freq: Once | ORAL | 0 refills | Status: AC

## 2022-12-18 NOTE — Progress Notes (Signed)

## 2023-01-02 ENCOUNTER — Encounter: Payer: Self-pay | Admitting: Gastroenterology

## 2023-01-07 ENCOUNTER — Ambulatory Visit (AMBULATORY_SURGERY_CENTER): Payer: 59 | Admitting: Gastroenterology

## 2023-01-07 ENCOUNTER — Encounter: Payer: Self-pay | Admitting: Gastroenterology

## 2023-01-07 VITALS — BP 129/75 | HR 64 | Temp 98.6°F | Resp 16 | Ht 70.0 in | Wt 275.0 lb

## 2023-01-07 DIAGNOSIS — Z86010 Personal history of colonic polyps: Secondary | ICD-10-CM | POA: Diagnosis not present

## 2023-01-07 DIAGNOSIS — Z09 Encounter for follow-up examination after completed treatment for conditions other than malignant neoplasm: Secondary | ICD-10-CM

## 2023-01-07 DIAGNOSIS — D128 Benign neoplasm of rectum: Secondary | ICD-10-CM | POA: Diagnosis not present

## 2023-01-07 DIAGNOSIS — D124 Benign neoplasm of descending colon: Secondary | ICD-10-CM

## 2023-01-07 DIAGNOSIS — D125 Benign neoplasm of sigmoid colon: Secondary | ICD-10-CM | POA: Diagnosis not present

## 2023-01-07 DIAGNOSIS — D127 Benign neoplasm of rectosigmoid junction: Secondary | ICD-10-CM | POA: Diagnosis not present

## 2023-01-07 MED ORDER — SODIUM CHLORIDE 0.9 % IV SOLN: 500.0000 mL | Freq: Once | INTRAVENOUS | Status: DC

## 2023-01-07 NOTE — Progress Notes (Signed)
Report to pacu rn. Vss. Care resumed by rn. 

## 2023-01-07 NOTE — Progress Notes (Signed)
Called to room to assist during endoscopic procedure.  Patient ID and intended procedure confirmed with present staff. Received instructions for my participation in the procedure from the performing physician.  

## 2023-01-07 NOTE — Progress Notes (Signed)
Pt's states no medical or surgical changes since previsit or office visit. 

## 2023-01-07 NOTE — Progress Notes (Signed)
History & Physical  Primary Care Physician:  Carol Ada, MD Primary Gastroenterologist: Lucio Edward, MD  Impression / Plan:  Personal history of adenomatous colon polyps for surveillance colonoscopy.  CHIEF COMPLAINT:   Personal history of colon polyps   HPI: Elizabeth Gentry is a 62 y.o. female with a personal history of adenomatous colon polyps for surveillance colonoscopy.   Past Medical History:  Diagnosis Date   Allergy    SEASONAL   Anxiety    Depression    Hashimoto's disease    Hyperlipidemia    Thyroid disease     Past Surgical History:  Procedure Laterality Date   ADENOIDECTOMY     COLONOSCOPY     KNEE SURGERY Right    MYRINGOTOMY     REDUCTION MAMMAPLASTY Bilateral 2016   Breast lift   uterine ablation- 2010  2010   dr holland for AUB    Prior to Admission medications   Medication Sig Start Date End Date Taking? Authorizing Provider  atorvastatin (LIPITOR) 20 MG tablet take 1 tablet by mouth once daily   Yes Swords, Darrick Penna, MD  Calcium Carbonate (CALCIUM 600 PO) Take by mouth daily.   Yes [provider]  cholecalciferol (VITAMIN D3) 25 MCG (1000 UT) tablet Take 1,000 Units by mouth daily.   Yes [provider]  FLUoxetine (PROZAC) 10 MG capsule take 1 capsule by mouth once daily   Yes Swords, Darrick Penna, MD  levothyroxine (SYNTHROID) 100 MCG tablet Take 100 mcg by mouth every morning. 01/04/20  Yes [provider]  Multiple Vitamin (MULTIVITAMIN) tablet Take 1 tablet by mouth daily.   Yes [provider]  Selenium 200 MCG CAPS Take 1 capsule by mouth daily.   Yes [provider]    Current Outpatient Medications  Medication Sig Dispense Refill   atorvastatin (LIPITOR) 20 MG tablet take 1 tablet by mouth once daily 90 tablet 0   Calcium Carbonate (CALCIUM 600 PO) Take by mouth daily.     cholecalciferol (VITAMIN D3) 25 MCG (1000 UT) tablet Take 1,000 Units by mouth daily.     FLUoxetine (PROZAC) 10 MG  capsule take 1 capsule by mouth once daily 30 capsule 5   levothyroxine (SYNTHROID) 100 MCG tablet Take 100 mcg by mouth every morning.     Multiple Vitamin (MULTIVITAMIN) tablet Take 1 tablet by mouth daily.     Selenium 200 MCG CAPS Take 1 capsule by mouth daily.     Current Facility-Administered Medications  Medication Dose Route Frequency Provider Last Rate Last Admin   0.9 %  sodium chloride infusion  500 mL Intravenous Once Lucio Edward T, MD       0.9 %  sodium chloride infusion  500 mL Intravenous Once Ladene Artist, MD        Allergies as of 01/07/2023 - Review Complete 01/07/2023  Allergen Reaction Noted   Sulfamethoxazole Anaphylaxis 06/05/2006   Other  12/18/2022    Family History  Problem Relation Age of Onset   Cancer Mother        breast   Diabetes Mother    Hypertension Mother    Hyperlipidemia Mother    Breast cancer Mother 72   Heart disease Mother    Cancer Father        lung   Colon polyps Father    Colon cancer Paternal Uncle    Breast cancer Maternal Grandmother 6   Colon cancer Maternal Grandfather    Colon cancer Paternal Grandmother  Stomach cancer Neg Hx    Esophageal cancer Neg Hx    Liver disease Neg Hx    Rectal cancer Neg Hx    Pancreatic cancer Neg Hx    Crohn's disease Neg Hx    Ulcerative colitis Neg Hx     Social History   Socioeconomic History   Marital status: Single    Spouse name: Not on file   Number of children: 0   Years of education: Not on file   Highest education level: Not on file  Occupational History   Not on file  Tobacco Use   Smoking status: Never   Smokeless tobacco: Never  Vaping Use   Vaping Use: Never used  Substance and Sexual Activity   Alcohol use: Yes    Alcohol/week: 3.0 standard drinks of alcohol    Types: 3 Glasses of wine per week    Comment: WINE 1 X PER WEEK   Drug use: No   Sexual activity: Not on file  Other Topics Concern   Not on file  Social History Narrative   Not on file    Social Determinants of Health   Financial Resource Strain: Not on file  Food Insecurity: Not on file  Transportation Needs: Not on file  Physical Activity: Not on file  Stress: Not on file  Social Connections: Not on file  Intimate Partner Violence: Not on file    Review of Systems:  All systems reviewed were negative except where noted in HPI.   Physical Exam: General:  Alert, well-developed, in NAD Head:  Normocephalic and atraumatic. Eyes:  Sclera clear, no icterus.   Conjunctiva pink. Ears:  Normal auditory acuity. Mouth:  No deformity or lesions.  Neck:  Supple; no masses. Lungs:  Clear throughout to auscultation.   No wheezes, crackles, or rhonchi.  Heart:  Regular rate and rhythm; no murmurs. Abdomen:  Soft, nondistended, nontender. No masses, hepatomegaly. No palpable masses.  Normal bowel sounds.    Rectal:  Deferred   Msk:  Symmetrical without gross deformities. Extremities:  Without edema. Neurologic:  Alert and  oriented x 4; grossly normal neurologically. Skin:  Intact without significant lesions or rashes. Psych:  Alert and cooperative. Normal mood and affect.   Pricilla Riffle. Fuller Plan  01/07/2023, 8:04 AM See Shea Evans, [facility], to contact our on call provider

## 2023-01-07 NOTE — Op Note (Signed)
[place] Patient Name: Elizabeth Gentry Procedure Date: 01/07/2023 7:59 AM MRN: [id number] Endoscopist: Ladene Artist , MD, [id number] Age: 62 Referring MD:  Date of Birth: [birth year] Gender: Female Account #: [id number] Procedure:                Colonoscopy Indications:              Surveillance: Personal history of adenomatous                            polyps on last colonoscopy 5 years ago Medicines:                Monitored Anesthesia Care Procedure:                Pre-Anesthesia Assessment:                           - Prior to the procedure, a History and Physical                            was performed, and patient medications and                            allergies were reviewed. The patient's tolerance of                            previous anesthesia was also reviewed. The risks                            and benefits of the procedure and the sedation                            options and risks were discussed with the patient.                            All questions were answered, and informed consent                            was obtained. Prior Anticoagulants: The patient has                            taken no anticoagulant or antiplatelet agents. ASA                            Grade Assessment: II - A patient with mild systemic                            disease. After reviewing the risks and benefits,                            the patient was deemed in satisfactory condition to                            undergo the procedure.  After obtaining informed consent, the colonoscope                            was passed under direct vision. Throughout the                            procedure, the patient's blood pressure, pulse, and                            oxygen saturations were monitored continuously. The                            Olympus CF-HQ190L ([id number]) Colonoscope was                            introduced through the  anus and advanced to the the                            cecum, identified by appendiceal orifice and                            ileocecal valve. The ileocecal valve, appendiceal                            orifice, and rectum were photographed. The quality                            of the bowel preparation was good. The colonoscopy                            was performed without difficulty. The patient                            tolerated the procedure well. Scope In: 8:07:18 AM Scope Out: 8:28:21 AM Scope Withdrawal Time: 0 hours 18 minutes 44 seconds  Total Procedure Duration: 0 hours 21 minutes 3 seconds  Findings:                 The perianal and digital rectal examinations were                            normal.                           Five sessile polyps were found in the rectum,                            sigmoid colon (2) and descending colon (2). The                            polyps were 5 to 8 mm in size. These polyps were                            removed with a cold snare. Resection and retrieval  were complete.                           Internal hemorrhoids were found during                            retroflexion. The hemorrhoids were small and Grade                            I (internal hemorrhoids that do not prolapse).                           The exam was otherwise without abnormality on                            direct and retroflexion views. Complications:            No immediate complications. Estimated blood loss:                            None. Estimated Blood Loss:     Estimated blood loss: none. Impression:               - Five 5 to 8 mm polyps in the rectum, in the                            sigmoid colon and in the descending colon, removed                            with a cold snare. Resected and retrieved.                           - Internal hemorrhoids.                           - The examination was otherwise normal on  direct                            and retroflexion views. Recommendation:           - Repeat colonoscopy after studies are complete for                            surveillance based on pathology results.                           - Patient has a contact number available for                            emergencies. The signs and symptoms of potential                            delayed complications were discussed with the                            patient. Return to normal activities tomorrow.  Written discharge instructions were provided to the                            patient.                           - Resume previous diet.                           - Continue present medications.                           - Await pathology results. Ladene Artist, MD 01/07/2023 8:31:30 AM This report has been signed electronically.

## 2023-01-07 NOTE — Patient Instructions (Signed)
Please read handouts provided. Continue present medications. Await pathology results.   YOU HAD AN ENDOSCOPIC PROCEDURE TODAY AT THE [facility]:   Refer to the procedure report that was given to you for any specific questions about what was found during the examination.  If the procedure report does not answer your questions, please call your gastroenterologist to clarify.  If you requested that your care partner not be given the details of your procedure findings, then the procedure report has been included in a sealed envelope for you to review at your convenience later.  YOU SHOULD EXPECT: Some feelings of bloating in the abdomen. Passage of more gas than usual.  Walking can help get rid of the air that was put into your GI tract during the procedure and reduce the bloating. If you had a lower endoscopy (such as a colonoscopy or flexible sigmoidoscopy) you may notice spotting of blood in your stool or on the toilet paper. If you underwent a bowel prep for your procedure, you may not have a normal bowel movement for a few days.  Please Note:  You might notice some irritation and congestion in your nose or some drainage.  This is from the oxygen used during your procedure.  There is no need for concern and it should clear up in a day or so.  SYMPTOMS TO REPORT IMMEDIATELY:  Following lower endoscopy (colonoscopy or flexible sigmoidoscopy):  Excessive amounts of blood in the stool  Significant tenderness or worsening of abdominal pains  Swelling of the abdomen that is new, acute  Fever of 100F or higher  For urgent or emergent issues, a gastroenterologist can be reached at any hour by calling [phone]. Do not use MyChart messaging for urgent concerns.    DIET:  We do recommend a small meal at first, but then you may proceed to your regular diet.  Drink plenty of fluids but you should avoid alcoholic beverages for 24 hours.  ACTIVITY:  You should plan to take it easy for  the rest of today and you should NOT DRIVE or use heavy machinery until tomorrow (because of the sedation medicines used during the test).    FOLLOW UP: Our staff will call the number listed on your records the next business day following your procedure.  We will call around 7:15- 8:00 am to check on you and address any questions or concerns that you may have regarding the information given to you following your procedure. If we do not reach you, we will leave a message.     If any biopsies were taken you will be contacted by phone or by letter within the next 1-3 weeks.  Please call us at [phone] if you have not heard about the biopsies in 3 weeks.    SIGNATURES/CONFIDENTIALITY: You and/or your care partner have signed paperwork which will be entered into your electronic medical record.  These signatures attest to the fact that that the information above on your After Visit Summary has been reviewed and is understood.  Full responsibility of the confidentiality of this discharge information lies with you and/or your care-partner. 

## 2023-01-08 ENCOUNTER — Telehealth: Payer: Self-pay

## 2023-01-08 NOTE — Telephone Encounter (Signed)
  Follow up Call-     01/07/2023    7:14 AM  Call back number  Post procedure Call Back phone  # [phone]  Permission to leave phone message Yes    Post op call attempted, no answer, left WM.

## 2023-01-22 ENCOUNTER — Encounter: Payer: Self-pay | Admitting: Gastroenterology

## 2023-03-18 ENCOUNTER — Encounter: Payer: Self-pay | Admitting: Orthopaedic Surgery

## 2023-03-18 ENCOUNTER — Other Ambulatory Visit (INDEPENDENT_AMBULATORY_CARE_PROVIDER_SITE_OTHER): Payer: 59

## 2023-03-18 ENCOUNTER — Ambulatory Visit: Payer: 59 | Admitting: Orthopaedic Surgery

## 2023-03-18 DIAGNOSIS — M545 Low back pain, unspecified: Secondary | ICD-10-CM | POA: Diagnosis not present

## 2023-03-18 MED ORDER — PREDNISONE 10 MG (21) PO TBPK: ORAL_TABLET | ORAL | 0 refills | Status: DC

## 2023-03-18 MED ORDER — METHOCARBAMOL 750 MG PO TABS: 750.0000 mg | ORAL_TABLET | Freq: Two times a day (BID) | ORAL | 0 refills | Status: DC | PRN

## 2023-03-18 NOTE — Progress Notes (Signed)
Office Visit Note   Patient: Elizabeth Gentry           Date of Birth: 1961-10-25           MRN: [id number] Visit Date: 03/18/2023              Requested by: Merri Brunette, MD [phone] [address] [place],  [place] [address] PCP: Merri Brunette, MD   Assessment & Plan: Visit Diagnoses:  1. Acute right-sided low back pain, unspecified whether sciatica present     Plan: Impression is lumbar spine strain.  At this point, would like to start him on a steroid pack and muscle relaxer.  Also like to start her in physical therapy and a referral has been made.  She is agreeable to this plan.  She will follow-up as needed.  Follow-Up Instructions: Return if symptoms worsen or fail to improve.   Orders:  Orders Placed This Encounter  Procedures   XR Lumbar Spine 2-3 Views   Ambulatory referral to Physical Therapy   Meds ordered this encounter  Medications   predniSONE (STERAPRED UNI-PAK 21 TAB) 10 MG (21) TBPK tablet    Sig: Take as directed    Dispense:  21 tablet    Refill:  0   methocarbamol (ROBAXIN-750) 750 MG tablet    Sig: Take 1 tablet (750 mg total) by mouth 2 (two) times daily as needed for muscle spasms.    Dispense:  20 tablet    Refill:  0      Procedures: No procedures performed   Clinical Data: No additional findings.   Subjective: Chief Complaint  Patient presents with   Lower Back - Pain    HPI patient is a pleasant 62 year old female who comes in today with low back pain for the past 6 weeks.  She was participating in water aerobics when she was doing rotating type exercises and felt pain the following day.  She has had pain to the right side of the middle back with radiation into the right low back right buttock and occasionally up the back of her her right thoracic spine and the parascapular region.  Pain appears to be worse when she is rolling over in bed or with any rotation of the lumbar spine.  She has been taking Tylenol and Advil without  relief.  She is also used heat and ice without improvement of symptoms.  She denies any weakness or paresthesias to either lower extremity.  No bowel or bladder change.  Review of Systems as detailed in HPI.  All others reviewed and are negative.   Objective: Vital Signs: There were no vitals taken for this visit.  Physical Exam well-developed well-nourished female no acute distress.  Alert and oriented x 3.  Ortho Exam lumbar spine exam: No spinous tenderness.  She does have right-sided tenderness to the paraspinous musculature.  Negative straight leg raise either side.  Increased pain with lumbar spine extension and rotation.  No focal weakness.  She is neurovascular intact distally.  Specialty Comments:  No specialty comments available.  Imaging: XR Lumbar Spine 2-3 Views  Result Date: 03/18/2023 Moderate multilevel degenerative changes     PMFS History: Patient Active Problem List   Diagnosis Date Noted   Hyperlipidemia 03/01/2014   Hypothyroid 06/10/2011   OBESITY 06/01/2009   DEPRESSION 06/01/2009   Past Medical History:  Diagnosis Date   Allergy    SEASONAL   Anxiety    Depression    Hashimoto's disease  Hyperlipidemia    Thyroid disease     Family History  Problem Relation Age of Onset   Cancer Mother        breast   Diabetes Mother    Hypertension Mother    Hyperlipidemia Mother    Breast cancer Mother 61   Heart disease Mother    Cancer Father        lung   Colon polyps Father    Colon cancer Paternal Uncle    Breast cancer Maternal Grandmother 42   Colon cancer Maternal Grandfather    Colon cancer Paternal Grandmother    Stomach cancer Neg Hx    Esophageal cancer Neg Hx    Liver disease Neg Hx    Rectal cancer Neg Hx    Pancreatic cancer Neg Hx    Crohn's disease Neg Hx    Ulcerative colitis Neg Hx     Past Surgical History:  Procedure Laterality Date   ADENOIDECTOMY     COLONOSCOPY     KNEE SURGERY Right    MYRINGOTOMY     REDUCTION  MAMMAPLASTY Bilateral 2016   Breast lift   uterine ablation- 2010  2010   dr holland for AUB   Social History   Occupational History   Not on file  Tobacco Use   Smoking status: Never   Smokeless tobacco: Never  Vaping Use   Vaping Use: Never used  Substance and Sexual Activity   Alcohol use: Yes    Alcohol/week: 3.0 standard drinks of alcohol    Types: 3 Glasses of wine per week    Comment: WINE 1 X PER WEEK   Drug use: No   Sexual activity: Not on file

## 2023-05-12 ENCOUNTER — Telehealth: Payer: Self-pay | Admitting: Genetic Counselor

## 2023-05-12 NOTE — Telephone Encounter (Signed)
Patient is aware of upcoming appointment times/dates.

## 2023-05-18 ENCOUNTER — Other Ambulatory Visit: Payer: Self-pay | Admitting: Family Medicine

## 2023-05-18 DIAGNOSIS — Z1231 Encounter for screening mammogram for malignant neoplasm of breast: Secondary | ICD-10-CM

## 2023-06-29 ENCOUNTER — Encounter: Payer: Self-pay | Admitting: Genetic Counselor

## 2023-06-30 ENCOUNTER — Inpatient Hospital Stay: Payer: 59 | Attending: Genetic Counselor | Admitting: Genetic Counselor

## 2023-06-30 ENCOUNTER — Other Ambulatory Visit: Payer: Self-pay | Admitting: Genetic Counselor

## 2023-06-30 ENCOUNTER — Encounter: Payer: Self-pay | Admitting: Genetic Counselor

## 2023-06-30 ENCOUNTER — Inpatient Hospital Stay: Payer: 59

## 2023-06-30 ENCOUNTER — Other Ambulatory Visit: Payer: Self-pay

## 2023-06-30 DIAGNOSIS — Z8042 Family history of malignant neoplasm of prostate: Secondary | ICD-10-CM

## 2023-06-30 DIAGNOSIS — Z803 Family history of malignant neoplasm of breast: Secondary | ICD-10-CM

## 2023-06-30 DIAGNOSIS — Z8041 Family history of malignant neoplasm of ovary: Secondary | ICD-10-CM

## 2023-06-30 DIAGNOSIS — Z800 Family history of malignant neoplasm of digestive organs: Secondary | ICD-10-CM

## 2023-06-30 DIAGNOSIS — Z808 Family history of malignant neoplasm of other organs or systems: Secondary | ICD-10-CM

## 2023-06-30 LAB — GENETIC SCREENING ORDER

## 2023-06-30 NOTE — Progress Notes (Signed)
REFERRING PROVIDER: Merri Brunette, MD [phone] [place],  [place] [address]  PRIMARY PROVIDER:  Merri Brunette, MD  PRIMARY REASON FOR VISIT:  1. Family history of breast cancer   2. Family history of ovarian cancer   3. Family history of melanoma   4. Family history of prostate cancer   5. Family history of colon cancer      HISTORY OF PRESENT ILLNESS:   Elizabeth Gentry, a 62 y.o. female, was seen for a [facility] cancer genetics consultation at the request of Dr. Katrinka Blazing due to a family history of cancer.  Ms. Denno presents to clinic today to discuss the possibility of a hereditary predisposition to cancer, genetic testing, and to further clarify her future cancer risks, as well as potential cancer risks for family members.   Ms. Tackett is a 62 y.o. female with no personal history of cancer.  The patient has a total of 10 adenoma polpys on colonoscopy.  CANCER HISTORY:  Oncology History   No history exists.     RISK FACTORS:  Menarche was at age 11-17.  First live birth at age N/A.  OCP use for approximately 8 years.  Ovaries intact: yes.  Hysterectomy: no.  Menopausal status: postmenopausal.  Colonoscopy: yes;  polyposis . Mammogram within the last year: yes. Number of breast biopsies: 0. Up to date with pelvic exams: yes. Any excessive radiation exposure in the past: no  Past Medical History:  Diagnosis Date   Allergy    SEASONAL   Anxiety    Depression    Family history of breast cancer    Family history of colon cancer    Family history of melanoma    Family history of ovarian cancer    Family history of prostate cancer    Hashimoto's disease    Hyperlipidemia    Thyroid disease     Past Surgical History:  Procedure Laterality Date   ADENOIDECTOMY     COLONOSCOPY     KNEE SURGERY Right    MYRINGOTOMY     REDUCTION MAMMAPLASTY Bilateral 2016   Breast lift   uterine ablation- 2010  2010   dr holland for AUB    Social History    Socioeconomic History   Marital status: Single    Spouse name: Not on file   Number of children: 0   Years of education: Not on file   Highest education level: Not on file  Occupational History   Not on file  Tobacco Use   Smoking status: Never   Smokeless tobacco: Never  Vaping Use   Vaping status: Never Used  Substance and Sexual Activity   Alcohol use: Yes    Alcohol/week: 3.0 standard drinks of alcohol    Types: 3 Glasses of wine per week    Comment: WINE 1 X PER WEEK   Drug use: No   Sexual activity: Not on file  Other Topics Concern   Not on file  Social History Narrative   Not on file   Social Determinants of Health   Financial Resource Strain: Not on file  Food Insecurity: Not on file  Transportation Needs: Not on file  Physical Activity: Not on file  Stress: Not on file  Social Connections: Not on file     FAMILY HISTORY:  We obtained a detailed, 4-generation family history.  Significant diagnoses are listed below: Family History  Problem Relation Age of Onset   Ovarian cancer Mother 49   Diabetes Mother  Hypertension Mother    Hyperlipidemia Mother    Breast cancer Mother 19   Heart disease Mother    Melanoma Mother 13   Lung cancer Father    Colon polyps Father    Prostate cancer Father 11   Lung cancer Maternal Uncle 26   Lung cancer Paternal Aunt 73   Liver cancer Paternal Uncle    Breast cancer Maternal Grandmother 62   Heart disease Maternal Grandfather 19   Colon cancer Paternal Grandfather 88   Liver cancer Cousin 55       mat first cousin   Stomach cancer Neg Hx    Esophageal cancer Neg Hx    Liver disease Neg Hx    Rectal cancer Neg Hx    Pancreatic cancer Neg Hx    Crohn's disease Neg Hx    Ulcerative colitis Neg Hx      The patient does not have children.  She has a brother and sister who are cancer free.  Her parents are both deceased.  The patient's mother was daignosed with melanoma at 87, breast cancer at 24 and  ovarian cancer at 69.  She has a brother who died of lung cancer.  Her mother had breast cancer at 32.  The patient's father had prostate cancer at 55 and lung cancer at 77.  He had two sisers and a brother, one sister had lung cancer and the brother had liver cancer.  The paternal grandfather had colon cancer.  Ms. Macadam is unaware of previous family history of genetic testing for hereditary cancer risks. There is no reported Ashkenazi Jewish ancestry. There is no known consanguinity.  GENETIC COUNSELING ASSESSMENT: Elizabeth Gentry is a 62 y.o. female with a family history of cancer which is somewhat suggestive of a hereditary cancer syndrome and predisposition to cancer given the combination of cancer. We, therefore, discussed and recommended the following at today's visit.   DISCUSSION: We discussed that, in general, most cancer is not inherited in families, but instead is sporadic or familial. Sporadic cancers occur by chance and typically happen at older ages (>50 years) as this type of cancer is caused by genetic changes acquired during an individual's lifetime. Some families have more cancers than would be expected by chance; however, the ages or types of cancer are not consistent with a known genetic mutation or known genetic mutations have been ruled out. This type of familial cancer is thought to be due to a combination of multiple genetic, environmental, hormonal, and lifestyle factors. While this combination of factors likely increases the risk of cancer, the exact source of this risk is not currently identifiable or testable.  We discussed that 5 - 10% of breast cancer, and up to 20% of ovarian cancer is hereditary, with most cases associated with BRCA mutations.  There are other genes that can be associated with hereditary breast cancer syndromes.  These include ATM, CHEK2 and PALB2. Some of those genes can increase the risk for ovarian cancer as well BRIP1, RAD51C, RAD51D and Lynch syndrome  genes.  We discussed that testing is beneficial for several reasons including knowing how to follow individuals after completing their treatment, identifying whether potential treatment options such as PARP inhibitors would be beneficial, and understand if other family members could be at risk for cancer and allow them to undergo genetic testing.   We reviewed the characteristics, features and inheritance patterns of hereditary cancer syndromes. We also discussed genetic testing, including the appropriate family members to test, the  process of testing, insurance coverage and turn-around-time for results. We discussed the implications of a negative, positive, carrier and/or variant of uncertain significant result. Ms. Hagans  was offered a common hereditary cancer panel (47 genes) and an expanded pan-cancer panel (77 genes). Ms. Feeser was informed of the benefits and limitations of each panel, including that expanded pan-cancer panels contain genes that do not have clear management guidelines at this point in time.  We also discussed that as the number of genes included on a panel increases, the chances of variants of uncertain significance increases. Ms. Brogden decided to pursue genetic testing for the CancerNext-Expanded+RNAinsight gene panel.   The CancerNext-Expanded gene panel offered by [facility] and includes sequencing and rearrangement analysis for the following 77 genes: AIP, ALK, APC*, ATM*, AXIN2, BAP1, BARD1, BMPR1A, BRCA1*, BRCA2*, BRIP1*, CDC73, CDH1*, CDK4, CDKN1B, CDKN2A, CHEK2*, CTNNA1, DICER1, FH, FLCN, KIF1B, LZTR1, MAX, MEN1, MET, MLH1*, MSH2*, MSH3, MSH6*, MUTYH*, NF1*, NF2, NTHL1, PALB2*, PHOX2B, PMS2*, POT1, PRKAR1A, PTCH1, PTEN*, RAD51C*, RAD51D*, RB1, RET, SDHA, SDHAF2, SDHB, SDHC, SDHD, SMAD4, SMARCA4, SMARCB1, SMARCE1, STK11, SUFU, TMEM127, TP53*, TSC1, TSC2, and VHL (sequencing and deletion/duplication); EGFR, EGLN1, HOXB13, KIT, MITF, PDGFRA, POLD1, and POLE (sequencing  only); EPCAM and GREM1 (deletion/duplication only). DNA and RNA analyses performed for * genes.   Based on Ms. Boggus's family history of cancer, she meets medical criteria for genetic testing. Despite that she meets criteria, she may still have an out of pocket cost. We discussed that if her out of pocket cost for testing is over $100, the laboratory will call and confirm whether she wants to proceed with testing.  If the out of pocket cost of testing is less than $100 she will be billed by the genetic testing laboratory.   We discussed that some people do not want to undergo genetic testing due to fear of genetic discrimination.  The Genetic Information Nondiscrimination Act (GINA) was signed into federal law in 2008. GINA prohibits health insurers and most employers from discriminating against individuals based on genetic information (including the results of genetic tests and family history information). According to GINA, health insurance companies cannot consider genetic information to be a preexisting condition, nor can they use it to make decisions regarding coverage or rates. GINA also makes it illegal for most employers to use genetic information in making decisions about hiring, firing, promotion, or terms of employment. It is important to note that GINA does not offer protections for life insurance, disability insurance, or long-term care insurance. GINA does not apply to those in the [employer], those who work for companies with less than 15 employees, and new life insurance or long-term disability insurance policies.  Health status due to a cancer diagnosis is not protected under GINA. More information about GINA can be found by visiting [email or web address].   PLAN: After considering the risks, benefits, and limitations, Ms. Ourada provided informed consent to pursue genetic testing and the blood sample was sent to [facility] for analysis of the CancerNext-Expanded+RNA. Results  should be available within approximately 2-3 weeks' time, at which point they will be disclosed by telephone to Ms. Maietta, as will any additional recommendations warranted by these results. Ms. Silverstein will receive a summary of her genetic counseling visit and a copy of her results once available. This information will also be available in Epic.   Lastly, we encouraged Ms. Guzy to remain in contact with cancer genetics annually so that we can continuously update the family history and inform her of  any changes in cancer genetics and testing that may be of benefit for this family.   Ms. Monsour questions were answered to her satisfaction today. Our contact information was provided should additional questions or concerns arise. Thank you for the referral and allowing [place] to share in the care of your patient.   Faithann Natal P. Lowell Guitar, MS, [facility] Licensed, Patent attorney [email or web address] phone: [phone]  The patient was seen for a total of 30 minutes in face-to-face genetic counseling.  The patient was seen alone.  Drs. Meliton Rattan, and/or [place] were available for questions, if needed..    _______________________________________________________________________ For Office Staff:  Number of people involved in session: 1 Was an Intern/ student involved with case: no

## 2023-07-02 ENCOUNTER — Ambulatory Visit
Admission: RE | Admit: 2023-07-02 | Discharge: 2023-07-02 | Disposition: A | Discharge status: Home / Self Care | Payer: Self-pay | Source: Ambulatory Visit | Attending: Family Medicine | Admitting: Family Medicine

## 2023-07-02 DIAGNOSIS — Z1231 Encounter for screening mammogram for malignant neoplasm of breast: Secondary | ICD-10-CM

## 2023-07-10 ENCOUNTER — Telehealth: Payer: Self-pay | Admitting: Genetic Counselor

## 2023-07-10 ENCOUNTER — Encounter: Payer: Self-pay | Admitting: Genetic Counselor

## 2023-07-10 ENCOUNTER — Ambulatory Visit: Payer: Self-pay | Admitting: Genetic Counselor

## 2023-07-10 DIAGNOSIS — Z1379 Encounter for other screening for genetic and chromosomal anomalies: Secondary | ICD-10-CM | POA: Insufficient documentation

## 2023-07-10 NOTE — Telephone Encounter (Signed)
Revealed negative genetic testing.  Discussed that we do not know why there is cancer in the family. It could be due to a different gene that we are not testing, or maybe our current technology may not be able to pick something up.  It will be important for her to keep in contact with genetics to keep up with whether additional testing may be needed.  

## 2023-07-10 NOTE — Progress Notes (Signed)
HPI:  Ms. Sandiford was previously seen in the [facility] due to a family history of cancer and concerns regarding a hereditary predisposition to cancer. Please refer to our prior cancer genetics clinic note for more information regarding our discussion, assessment and recommendations, at the time. Ms. Sparger recent genetic test results were disclosed to her, as were recommendations warranted by these results. These results and recommendations are discussed in more detail below.  CANCER HISTORY:  Oncology History   No history exists.    FAMILY HISTORY:  We obtained a detailed, 4-generation family history.  Significant diagnoses are listed below: Family History  Problem Relation Age of Onset   Ovarian cancer Mother 49   Diabetes Mother    Hypertension Mother    Hyperlipidemia Mother    Breast cancer Mother 8   Heart disease Mother    Melanoma Mother 41   Lung cancer Father    Colon polyps Father    Prostate cancer Father 29   Lung cancer Maternal Uncle 23   Lung cancer Paternal Aunt 7   Liver cancer Paternal Uncle    Breast cancer Maternal Grandmother 45   Heart disease Maternal Grandfather 60   Colon cancer Paternal Grandfather 43   Liver cancer Cousin 55       mat first cousin   Stomach cancer Neg Hx    Esophageal cancer Neg Hx    Liver disease Neg Hx    Rectal cancer Neg Hx    Pancreatic cancer Neg Hx    Crohn's disease Neg Hx    Ulcerative colitis Neg Hx        The patient does not have children.  She has a brother and sister who are cancer free.  Her parents are both deceased.   The patient's mother was diagnosed with melanoma at 14, breast cancer at 60 and ovarian cancer at 40.  She has a brother who died of lung cancer.  Her mother had breast cancer at 7.   The patient's father had prostate cancer at 52 and lung cancer at 14.  He had two sisters and a brother, one sister had lung cancer and the brother had liver cancer.  The paternal  grandfather had colon cancer.   Ms. Mcglynn is unaware of previous family history of genetic testing for hereditary cancer risks. There is no reported Ashkenazi Jewish ancestry. There is no known consanguinity.  GENETIC TEST RESULTS: Genetic testing reported out on July 06, 2023 through the CancerNext-Expanded+RNAinsight cancer panel found no pathogenic mutations. The CancerNext-Expanded gene panel offered by [facility] and includes sequencing and rearrangement analysis for the following 77 genes: AIP, ALK, APC*, ATM*, AXIN2, BAP1, BARD1, BMPR1A, BRCA1*, BRCA2*, BRIP1*, CDC73, CDH1*, CDK4, CDKN1B, CDKN2A, CHEK2*, CTNNA1, DICER1, FH, FLCN, KIF1B, LZTR1, MAX, MEN1, MET, MLH1*, MSH2*, MSH3, MSH6*, MUTYH*, NF1*, NF2, NTHL1, PALB2*, PHOX2B, PMS2*, POT1, PRKAR1A, PTCH1, PTEN*, RAD51C*, RAD51D*, RB1, RET, SDHA, SDHAF2, SDHB, SDHC, SDHD, SMAD4, SMARCA4, SMARCB1, SMARCE1, STK11, SUFU, TMEM127, TP53*, TSC1, TSC2, and VHL (sequencing and deletion/duplication); EGFR, EGLN1, HOXB13, KIT, MITF, PDGFRA, POLD1, and POLE (sequencing only); EPCAM and GREM1 (deletion/duplication only). DNA and RNA analyses performed for * genes. The test report has been scanned into EPIC and is located under the Molecular Pathology section of the Results Review tab.  A portion of the result report is included below for reference.     We discussed with Ms. Selke that because current genetic testing is not perfect, it is possible there may be  a gene mutation in one of these genes that current testing cannot detect, but that chance is small.  We also discussed, that there could be another gene that has not yet been discovered, or that we have not yet tested, that is responsible for the cancer diagnoses in the family. It is also possible there is a hereditary cause for the cancer in the family that Ms. Cavalieri did not inherit and therefore was not identified in her testing.  Therefore, it is important to remain in touch with cancer  genetics in the future so that we can continue to offer Ms. Hughlett the most up to date genetic testing.   ADDITIONAL GENETIC TESTING: We discussed with Ms. Wall that her genetic testing was fairly extensive.  If there are genes identified to increase cancer risk that can be analyzed in the future, we would be happy to discuss and coordinate this testing at that time.    CANCER SCREENING RECOMMENDATIONS: Ms. Patchen test result is considered negative (normal).  This means that we have not identified a hereditary cause for her family history of cancer at this time. Most cancers happen by chance and this negative test suggests that her cancer may fall into this category.    Possible reasons for Ms. Donn's negative genetic test include:  1. There may be a gene mutation in one of these genes that current testing methods cannot detect but that chance is small.  2. There could be another gene that has not yet been discovered, or that we have not yet tested, that is responsible for the cancer diagnoses in the family.  3.  There may be no hereditary risk for cancer in the family. The cancers in Ms. Kosta and/or her family may be sporadic/familial or due to other genetic and environmental factors. 4. It is also possible there is a hereditary cause for the cancer in the family that Ms. Arteaga did not inherit.  Therefore, it is recommended she continue to follow the cancer management and screening guidelines provided by her primary healthcare provider. An individual's cancer risk and medical management are not determined by genetic test results alone. Overall cancer risk assessment incorporates additional factors, including personal medical history, family history, and any available genetic information that may result in a personalized plan for cancer prevention and surveillance  RECOMMENDATIONS FOR FAMILY MEMBERS:  Individuals in this family might be at some increased risk of developing cancer,  over the general population risk, simply due to the family history of cancer.  We recommended women in this family have a yearly mammogram beginning at age 98, or 42 years younger than the earliest onset of cancer, an annual clinical breast exam, and perform monthly breast self-exams. Women in this family should also have a gynecological exam as recommended by their primary provider. All family members should be referred for colonoscopy starting at age 39.  It is also possible there is a hereditary cause for the cancer in Ms. Siefring's family that she did not inherit and therefore was not identified in her.  Based on Ms. Lascala's family history, we recommended her siblings have genetic counseling and testing. Ms. Dauphin will let us know if we can be of any assistance in coordinating genetic counseling and/or testing for this family member.   FOLLOW-UP: Lastly, we discussed with Ms. Nepomuceno that cancer genetics is a rapidly advancing field and it is possible that new genetic tests will be appropriate for her and/or her family members in the future. We  encouraged her to remain in contact with cancer genetics on an annual basis so we can update her personal and family histories and let her know of advances in cancer genetics that may benefit this family.   Our contact number was provided. Ms. Fischetti questions were answered to her satisfaction, and she knows she is welcome to call us at anytime with additional questions or concerns.   Maylon Cos, MS, [facility]: Lambert Rhonda W Licensed, Certified Genetic Counselor [email or web address]

## 2023-07-10 NOTE — Telephone Encounter (Signed)
LM on VM that results were back and to please call. ?

## 2023-11-27 ENCOUNTER — Ambulatory Visit (INDEPENDENT_AMBULATORY_CARE_PROVIDER_SITE_OTHER): Payer: 59 | Admitting: Orthopaedic Surgery

## 2023-11-27 ENCOUNTER — Other Ambulatory Visit (INDEPENDENT_AMBULATORY_CARE_PROVIDER_SITE_OTHER): Payer: 59

## 2023-11-27 ENCOUNTER — Encounter: Payer: Self-pay | Admitting: Orthopaedic Surgery

## 2023-11-27 DIAGNOSIS — M79672 Pain in left foot: Secondary | ICD-10-CM

## 2023-11-27 MED ORDER — LIDOCAINE HCL 1 % IJ SOLN
1.0000 mL | INTRAMUSCULAR | Status: AC | PRN
Start: 2023-11-27 — End: 2023-11-27
  Administered 2023-11-27: 1 mL

## 2023-11-27 MED ORDER — BUPIVACAINE HCL 0.5 % IJ SOLN
1.0000 mL | INTRAMUSCULAR | Status: AC | PRN
Start: 2023-11-27 — End: 2023-11-27
  Administered 2023-11-27: 1 mL

## 2023-11-27 MED ORDER — METHYLPREDNISOLONE ACETATE 40 MG/ML IJ SUSP
40.0000 mg | INTRAMUSCULAR | Status: AC | PRN
  Administered 2023-11-27: 40 mg

## 2023-11-27 NOTE — Progress Notes (Signed)
Office Visit Note   Patient: Elizabeth Gentry           Date of Birth: 06/08/1961           MRN: [id number] Visit Date: 11/27/2023              Requested by: Merri Brunette, MD [phone] [address] [place],  [place] [address] PCP: Merri Brunette, MD   Assessment & Plan: Visit Diagnoses:  1. Pain of left heel     Plan: Irihanna is a 62 year old female with left plantar fasciitis.  Based on the lack of relief from various treatments that she has already done on her own we decided to do a cortisone injection.  She tolerated this well.  Will see her back as needed.  Follow-Up Instructions: No follow-ups on file.   Orders:  Orders Placed This Encounter  Procedures   Foot Inj   XR Os Calcis Left   No orders of the defined types were placed in this encounter.     Procedures: Foot Inj: left plantar fascia  Date/Time: 11/27/2023 4:28 PM  Performed by: Tarry Kos, MD Authorized by: Tarry Kos, MD   Consent Given by:  Patient Timeout: prior to procedure the correct patient, procedure, and site was verified   Indications:  Pain and fasciitis Condition: Plantar Fasciitis   Location: left plantar fascia muscle   Prep: patient was prepped and draped in usual sterile fashion   Needle Size:  25 G Medications:  1 mL lidocaine 1 %; 1 mL bupivacaine 0.5 %; 40 mg methylPREDNISolone acetate 40 MG/ML    Clinical Data: No additional findings.   Subjective: Chief Complaint  Patient presents with   Left Foot - Pain    HPI Elizabeth Gentry is a 62 year old female here for evaluation of left heel pain for 3 months.  Pain is constant throbs at night and it is interfering with ADLs.  Has tried basically all home remedies and splints and supports.  She has not found significant relief. Review of Systems  Constitutional: Negative.   HENT: Negative.    Eyes: Negative.   Respiratory: Negative.    Cardiovascular: Negative.   Endocrine: Negative.   Musculoskeletal: Negative.    Neurological: Negative.   Hematological: Negative.   Psychiatric/Behavioral: Negative.    All other systems reviewed and are negative.    Objective: Vital Signs: There were no vitals taken for this visit.  Physical Exam Vitals and nursing note reviewed.  Constitutional:      Appearance: She is well-developed.  HENT:     Head: Normocephalic and atraumatic.  Pulmonary:     Effort: Pulmonary effort is normal.  Abdominal:     Palpations: Abdomen is soft.  Musculoskeletal:     Cervical back: Neck supple.  Skin:    General: Skin is warm.     Capillary Refill: Capillary refill takes less than 2 seconds.  Neurological:     Mental Status: She is alert and oriented to person, place, and time.  Psychiatric:        Behavior: Behavior normal.        Thought Content: Thought content normal.        Judgment: Judgment normal.     Ortho Exam Exam of the left heel shows tenderness palpation at the plantar fascia insertion.  No tenderness at the Achilles insertion.  Posterior tibial tendon is nontender. Specialty Comments:  No specialty comments available.  Imaging: XR Os Calcis Left Result Date: 11/27/2023  X-rays of the left heel show a small plantar calcaneal spur.  No acute abnormalities.    PMFS History: Patient Active Problem List   Diagnosis Date Noted   Genetic testing 07/10/2023   Family history of breast cancer    Family history of ovarian cancer    Family history of melanoma    Family history of prostate cancer    Family history of colon cancer    Hyperlipidemia 03/01/2014   Hypothyroid 06/10/2011   OBESITY 06/01/2009   DEPRESSION 06/01/2009   Past Medical History:  Diagnosis Date   Allergy    SEASONAL   Anxiety    Depression    Family history of breast cancer    Family history of colon cancer    Family history of melanoma    Family history of ovarian cancer    Family history of prostate cancer    Hashimoto's disease    Hyperlipidemia    Thyroid  disease     Family History  Problem Relation Age of Onset   Ovarian cancer Mother 52   Diabetes Mother    Hypertension Mother    Hyperlipidemia Mother    Breast cancer Mother 40   Heart disease Mother    Melanoma Mother 10   Lung cancer Father    Colon polyps Father    Prostate cancer Father 81   Lung cancer Maternal Uncle 44   Lung cancer Paternal Aunt 80   Liver cancer Paternal Uncle    Breast cancer Maternal Grandmother 94   Heart disease Maternal Grandfather 70   Colon cancer Paternal Grandfather 79   Liver cancer Cousin 55       mat first cousin   Stomach cancer Neg Hx    Esophageal cancer Neg Hx    Liver disease Neg Hx    Rectal cancer Neg Hx    Pancreatic cancer Neg Hx    Crohn's disease Neg Hx    Ulcerative colitis Neg Hx     Past Surgical History:  Procedure Laterality Date   ADENOIDECTOMY     COLONOSCOPY     KNEE SURGERY Right    MYRINGOTOMY     REDUCTION MAMMAPLASTY Bilateral 2016   Breast lift   uterine ablation- 2010  2010   dr holland for AUB   Social History   Occupational History   Not on file  Tobacco Use   Smoking status: Never   Smokeless tobacco: Never  Vaping Use   Vaping status: Never Used  Substance and Sexual Activity   Alcohol use: Yes    Alcohol/week: 3.0 standard drinks of alcohol    Types: 3 Glasses of wine per week    Comment: WINE 1 X PER WEEK   Drug use: No   Sexual activity: Not on file

## 2024-01-04 ENCOUNTER — Telehealth: Payer: Self-pay | Admitting: Nurse Practitioner

## 2024-01-04 NOTE — Telephone Encounter (Signed)
Patient is aware of scheudled New Patient appointment, patient is aware of times/dates/location of appointment; Patient has been advised to show up at least 15 mins prior for registration

## 2024-01-08 ENCOUNTER — Ambulatory Visit: Payer: 59 | Admitting: Internal Medicine

## 2024-01-08 ENCOUNTER — Encounter: Payer: Self-pay | Admitting: Internal Medicine

## 2024-01-08 ENCOUNTER — Other Ambulatory Visit (INDEPENDENT_AMBULATORY_CARE_PROVIDER_SITE_OTHER): Payer: 59

## 2024-01-08 VITALS — BP 134/76 | HR 68 | Ht 69.0 in | Wt 300.0 lb

## 2024-01-08 DIAGNOSIS — R195 Other fecal abnormalities: Secondary | ICD-10-CM

## 2024-01-08 DIAGNOSIS — D509 Iron deficiency anemia, unspecified: Secondary | ICD-10-CM

## 2024-01-08 LAB — CBC WITH DIFFERENTIAL/PLATELET
Basophils Absolute: 0 10*3/uL (ref 0.0–0.1)
Basophils Relative: 0.5 % (ref 0.0–3.0)
Eosinophils Absolute: 0.1 10*3/uL (ref 0.0–0.7)
Eosinophils Relative: 0.9 % (ref 0.0–5.0)
HCT: 35.5 % — ABNORMAL LOW (ref 36.0–46.0)
Hemoglobin: 11 g/dL — ABNORMAL LOW (ref 12.0–15.0)
Lymphocytes Relative: 25.5 % (ref 12.0–46.0)
Lymphs Abs: 1.8 10*3/uL (ref 0.7–4.0)
MCHC: 30.9 g/dL (ref 30.0–36.0)
MCV: 79.2 fL (ref 78.0–100.0)
Monocytes Absolute: 0.6 10*3/uL (ref 0.1–1.0)
Monocytes Relative: 8.7 % (ref 3.0–12.0)
Neutro Abs: 4.6 10*3/uL (ref 1.4–7.7)
Neutrophils Relative %: 64.4 % (ref 43.0–77.0)
Platelets: 304 10*3/uL (ref 150.0–400.0)
RBC: 4.48 Mil/uL (ref 3.87–5.11)
RDW: 25.2 % — ABNORMAL HIGH (ref 11.5–15.5)
WBC: 7.1 10*3/uL (ref 4.0–10.5)

## 2024-01-08 LAB — VITAMIN B12: Vitamin B-12: 314 pg/mL (ref 211–911)

## 2024-01-08 LAB — FOLATE: Folate: 19.5 ng/mL (ref >5.9)

## 2024-01-08 NOTE — Patient Instructions (Signed)
 Your provider has requested that you go to the basement level for lab work before leaving today. Press "B" on the elevator. The lab is located at the first door on the left as you exit the elevator.  Due to recent changes in healthcare laws, you may see the results of your imaging and laboratory studies on MyChart before your provider has had a chance to review them.  We understand that in some cases there may be results that are confusing or concerning to you. Not all laboratory results come back in the same time frame and the provider may be waiting for multiple results in order to interpret others.  Please give [place] 48 hours in order for your provider to thoroughly review all the results before contacting the office for clarification of your results.   You have been scheduled for an endoscopy. Please follow written instructions given to you at your visit today.  If you use inhalers (even only as needed), please bring them with you on the day of your procedure.  If you take any of the following medications, they will need to be adjusted prior to your procedure:   DO NOT TAKE 7 DAYS PRIOR TO TEST- Trulicity (dulaglutide) Ozempic, Wegovy (semaglutide) Mounjaro (tirzepatide) Bydureon Bcise (exanatide extended release)  DO NOT TAKE 1 DAY PRIOR TO YOUR TEST Rybelsus (semaglutide) Adlyxin (lixisenatide) Victoza (liraglutide) Byetta (exanatide) ___________________________________________________________________________   I appreciate the opportunity to care for you. Stan Head, MD, [facility]

## 2024-01-08 NOTE — Progress Notes (Signed)
Elizabeth Gentry 63 y.o. 1961-01-13 [id number]  Assessment & Plan:   Encounter Diagnoses  Name Primary?   Heme + stool Yes   Microcytic anemia    Suspect iron deficiency anemia due to chronic blood loss based upon what we know.  Evaluate with EGD.  Colonoscopy 1 year ago without source..  The risks and benefits as well as alternatives of endoscopic procedure(s) have been discussed and reviewed. All questions answered. The patient agrees to proceed.   Orders Placed This Encounter  Procedures   CBC with Differential/Platelet   Vitamin B12   Folate   Iron, TIBC and Ferritin Panel   Ambulatory referral to Gastroenterology    CC: Merri Brunette, MD    Subjective:   Chief Complaint: Heme positive stool and new anemia  HPI 63 year old woman with a history of hypothyroidism and hyperlipidemia, previously a patient of Dr. Russella Dar followed for colon polyps, here at the request of Dr. Katrinka Blazing because of new anemia and heme positive stools.  She has not noted any rectal bleeding or melena.  She has some fatigue.  She has been a blood donor but not since before COVID.  She has rare heartburn and even rare episodes of reflux of hot fluid/regurgitation due to certain food intolerances.  No abdominal pain altered bowel habits other than slightly looser than normal, no dysphagia.  She does not use NSAIDs or aspirin.  She recently developed pica with ice.  She has shown me the labs that were drawn.  In early January her hemoglobin was 9.1 hematocrit 29 with normal white count and platelet count.  Then on January 23 hemoglobin 9.3 hematocrit 29.8.  The MCV is 73.  3 of 3 guaiac occult blood cards are positive.  She has been started on iron.  She has been referred to hematology.  There is a strong family history of multiple cancers and she was evaluated in genetics but no mutation was found, last year.  Colonoscopy 01/07/23 - Five 5 to 8 mm polyps in the rectum, in the sigmoid colon and in the  descending colon, removed with a cold snare. Resected and retrieved. - Internal hemorrhoids. - The examination was otherwise normal on direct and retroflexion views.   All adenomas recall 2027   Allergies  Allergen Reactions   Sulfamethoxazole Anaphylaxis   Other     OYSTERS,ILL   Current Meds  Medication Sig   AMBULATORY NON FORMULARY MEDICATION 1 tablet daily in the afternoon. Medication Name: ferrous sulphate and vitamin c   atorvastatin (LIPITOR) 20 MG tablet take 1 tablet by mouth once daily   cholecalciferol (VITAMIN D3) 25 MCG (1000 UT) tablet Take 1,000 Units by mouth daily.   FLUoxetine (PROZAC) 10 MG capsule take 1 capsule by mouth once daily   levothyroxine (SYNTHROID) 100 MCG tablet Take 100 mcg by mouth every morning.   Multiple Vitamin (MULTIVITAMIN) tablet Take 1 tablet by mouth daily.   Selenium 200 MCG CAPS Take 1 capsule by mouth daily.   Current Facility-Administered Medications for the 01/08/24 encounter (Office Visit) with Iva Boop, MD  Medication   0.9 %  sodium chloride infusion   Past Medical History:  Diagnosis Date   Allergy    SEASONAL   Anemia    Anxiety    Depression    Family history of breast cancer    Family history of colon cancer    Family history of melanoma    Family history of ovarian cancer    Family  history of prostate cancer    Hashimoto's disease    Hyperlipidemia    Thyroid disease    Past Surgical History:  Procedure Laterality Date   ADENOIDECTOMY     COLONOSCOPY     KNEE SURGERY Right    MYRINGOTOMY     REDUCTION MAMMAPLASTY Bilateral 2016   Breast lift   uterine ablation- 2010  2010   dr holland for AUB   Social History  Patient is single, no children.  1 glass of wine weekly no drug use never smoker/tobacco  family history includes Breast cancer (age of onset: 78) in her mother; Breast cancer (age of onset: 40) in her maternal grandmother; Cancer in her mother; Colon cancer (age of onset: 50) in her paternal  grandfather; Colon polyps in her father; Diabetes in her mother; Heart disease in her mother; Heart disease (age of onset: 20) in her maternal grandfather; Hyperlipidemia in her mother; Hypertension in her mother; Liver cancer in her paternal uncle; Liver cancer (age of onset: 93) in her cousin; Lung cancer in her father; Lung cancer (age of onset: 71) in her maternal uncle; Lung cancer (age of onset: 43) in her paternal aunt; Melanoma (age of onset: 29) in her mother; Ovarian cancer (age of onset: 45) in her mother; Prostate cancer (age of onset: 25) in her father.   Review of Systems  As per HPI Objective:   Physical Exam @BP  134/76   Pulse 68   Ht 5\' 9"  (1.753 m)   Wt 300 lb (136.1 kg) Comment: verbal  BMI 44.30 kg/m @  General:  NAD Eyes:   anicteric Lungs:  clear Heart::  S1S2 no rubs, murmurs or gallops Abdomen:  soft and nontender, BS+ Ext:   no edema, cyanosis or clubbing    Data Reviewed:  As per HPI

## 2024-01-09 LAB — IRON,TIBC AND FERRITIN PANEL
%SAT: 14 % — ABNORMAL LOW (ref 16–45)
Ferritin: 24 ng/mL (ref 16–288)
Iron: 58 ug/dL (ref 45–160)
TIBC: 401 ug/dL (ref 250–450)

## 2024-01-10 ENCOUNTER — Encounter: Payer: Self-pay | Admitting: Internal Medicine

## 2024-01-11 ENCOUNTER — Encounter: Payer: Self-pay | Admitting: Internal Medicine

## 2024-01-14 ENCOUNTER — Encounter: Payer: Self-pay | Admitting: Internal Medicine

## 2024-01-14 ENCOUNTER — Telehealth: Payer: Self-pay | Admitting: Nurse Practitioner

## 2024-01-14 ENCOUNTER — Ambulatory Visit (AMBULATORY_SURGERY_CENTER): Payer: 59 | Admitting: Internal Medicine

## 2024-01-14 VITALS — BP 153/84 | HR 66 | Temp 98.8°F | Resp 17 | Ht 69.0 in | Wt 300.0 lb

## 2024-01-14 DIAGNOSIS — K259 Gastric ulcer, unspecified as acute or chronic, without hemorrhage or perforation: Secondary | ICD-10-CM | POA: Diagnosis not present

## 2024-01-14 DIAGNOSIS — R195 Other fecal abnormalities: Secondary | ICD-10-CM

## 2024-01-14 DIAGNOSIS — K449 Diaphragmatic hernia without obstruction or gangrene: Secondary | ICD-10-CM

## 2024-01-14 DIAGNOSIS — K254 Chronic or unspecified gastric ulcer with hemorrhage: Secondary | ICD-10-CM | POA: Diagnosis not present

## 2024-01-14 DIAGNOSIS — K257 Chronic gastric ulcer without hemorrhage or perforation: Secondary | ICD-10-CM | POA: Insufficient documentation

## 2024-01-14 DIAGNOSIS — D509 Iron deficiency anemia, unspecified: Secondary | ICD-10-CM

## 2024-01-14 HISTORY — DX: Chronic gastric ulcer without hemorrhage or perforation: K25.7

## 2024-01-14 MED ORDER — SODIUM CHLORIDE 0.9 % IV SOLN: 500.0000 mL | Freq: Once | INTRAVENOUS | Status: AC

## 2024-01-14 MED ORDER — OMEPRAZOLE 40 MG PO CPDR: 40.0000 mg | DELAYED_RELEASE_CAPSULE | Freq: Every day | ORAL | 3 refills | Status: DC

## 2024-01-14 NOTE — Op Note (Signed)
 [facility] Patient Name: Elizabeth Gentry Procedure Date: 01/14/2024 7:58 AM MRN: [id number] Endoscopist: Lupita FORBES Commander , MD, [id number] Age: 63 Referring MD:  Date of Birth: [birth year] Gender: Female Account #: [id number] Procedure:                Upper GI endoscopy Indications:              Iron deficiency anemia, Heme positive stool Medicines:                Monitored Anesthesia Care Procedure:                Pre-Anesthesia Assessment:                           - Prior to the procedure, a History and Physical                            was performed, and patient medications and                            allergies were reviewed. The patient's tolerance of                            previous anesthesia was also reviewed. The risks                            and benefits of the procedure and the sedation                            options and risks were discussed with the patient.                            All questions were answered, and informed consent                            was obtained. Prior Anticoagulants: The patient has                            taken no anticoagulant or antiplatelet agents. ASA                            Grade Assessment: II - A patient with mild systemic                            disease. After reviewing the risks and benefits,                            the patient was deemed in satisfactory condition to                            undergo the procedure.                           After obtaining informed consent, the endoscope was  passed under direct vision. Throughout the                            procedure, the patient's blood pressure, pulse, and                            oxygen saturations were monitored continuously. The                            GIF [id number] #[id number] was introduced through the                            mouth, and advanced to the second part of duodenum.                            The upper  GI endoscopy was accomplished without                            difficulty. The patient tolerated the procedure                            well. Scope In: Scope Out: Findings:                 A 5 cm hiatal hernia with multiple Cameron ulcers                            was found. The hiatal narrowing was 40 cm from the                            incisors. The Z-line was 35 cm from the incisors.                            Biopsies were taken with a cold forceps for                            histology. Verification of patient identification                            for the specimen was done. Estimated blood loss was                            minimal.                           The gastroesophageal flap valve was visualized                            endoscopically and classified as Hill Grade IV (no                            fold, wide open lumen, hiatal hernia present).  Patchy mildly erythematous mucosa without bleeding                            was found in the gastric antrum. Biopsies were                            taken with a cold forceps for histology.                            Verification of patient identification for the                            specimen was done. Estimated blood loss was minimal.                           The cardia and gastric fundus were otherwise normal                            on retroflexion.                           The exam was otherwise without abnormality. Complications:            No immediate complications. Estimated Blood Loss:     Estimated blood loss was minimal. Impression:               - 5 cm hiatal hernia with multiple Cameron ulcers.                            Biopsied ulcers/erosions                           - Gastroesophageal flap valve classified as Hill                            Grade IV (no fold, wide open lumen, hiatal hernia                            present).                           -  Erythematous mucosa in the antrum. Biopsied. r/O                            H pylori                           - The examination was otherwise normal. Recommendation:           - Patient has a contact number available for                            emergencies. The signs and symptoms of potential                            delayed complications were discussed with the  patient. Return to normal activities tomorrow.                            Written discharge instructions were provided to the                            patient.                           - Await pathology results.                           - Start omeprazole  40 mg qd and stay on ferrous                            sulfate chronically                           BMI is too high to consider surgical repair of                            hernia - needs to be < 35 Lupita FORBES Commander, MD 01/14/2024 8:32:09 AM This report has been signed electronically.

## 2024-01-14 NOTE — Progress Notes (Signed)
 A/o x 3, VSS, gd SR's, pleased with anesthesia, report to RN

## 2024-01-14 NOTE — Patient Instructions (Addendum)
 I found the problem - you have a hiatal hernia and what are called Cameron erosions or ulcers. The diaphragm pinches the stomach and creates these little ulcers and they leak blood. The only true fix is to have the hiatal hernia repaired but that is not done in people with BMI > 35.  I took some biopsies to check for infection which can be an issue.  The typical medical treatment is to take an acid blocker daily and iron pills. You will need to stay on iron unless you were to eventually have the hernia repaired.  I will follow-up with biopsy results.  I appreciate the opportunity to care for you. Lupita [name], MD, FACG  YOU HAD AN ENDOSCOPIC PROCEDURE TODAY AT THE [facility]:   Refer to the procedure report that was given to you for any specific questions about what was found during the examination.  If the procedure report does not answer your questions, please call your gastroenterologist to clarify.  If you requested that your care partner not be given the details of your procedure findings, then the procedure report has been included in a sealed envelope for you to review at your convenience later.  YOU SHOULD EXPECT: Some feelings of bloating in the abdomen. Passage of more gas than usual.  Walking can help get rid of the air that was put into your GI tract during the procedure and reduce the bloating. If you had a lower endoscopy (such as a colonoscopy or flexible sigmoidoscopy) you may notice spotting of blood in your stool or on the toilet paper. If you underwent a bowel prep for your procedure, you may not have a normal bowel movement for a few days.  Please Note:  You might notice some irritation and congestion in your nose or some drainage.  This is from the oxygen used during your procedure.  There is no need for concern and it should clear up in a day or so.  SYMPTOMS TO REPORT IMMEDIATELY:  Following upper endoscopy (EGD)  Vomiting of blood or coffee ground  material  New chest pain or pain under the shoulder blades  Painful or persistently difficult swallowing  New shortness of breath  Fever of 100F or higher  Black, tarry-looking stools  For urgent or emergent issues, a gastroenterologist can be reached at any hour by calling [phone]. Do not use MyChart messaging for urgent concerns.    DIET:  We do recommend a small meal at first, but then you may proceed to your regular diet.  Drink plenty of fluids but you should avoid alcoholic beverages for 24 hours.  ACTIVITY:  You should plan to take it easy for the rest of today and you should NOT DRIVE or use heavy machinery until tomorrow (because of the sedation medicines used during the test).    FOLLOW UP: Our staff will call the number listed on your records the next business day following your procedure.  We will call around 7:15- 8:00 am to check on you and address any questions or concerns that you may have regarding the information given to you following your procedure. If we do not reach you, we will leave a message.     If any biopsies were taken you will be contacted by phone or by letter within the next 1-3 weeks.  Please call us  at [phone] if you have not heard about the biopsies in 3 weeks.    SIGNATURES/CONFIDENTIALITY: You and/or your care partner  have signed paperwork which will be entered into your electronic medical record.  These signatures attest to the fact that that the information above on your After Visit Summary has been reviewed and is understood.  Full responsibility of the confidentiality of this discharge information lies with you and/or your care-partner.

## 2024-01-14 NOTE — Progress Notes (Signed)
 Pt's states no medical or surgical changes since previsit or office visit.

## 2024-01-14 NOTE — Progress Notes (Signed)
 History and Physical Interval Note:  01/14/2024 8:09 AM  Elizabeth Gentry  has presented today for endoscopic procedure(s), with the diagnosis of  Encounter Diagnoses  Name Primary?   Heme + stool Yes   Iron deficiency anemia, unspecified iron deficiency anemia type   .  The various methods of evaluation and treatment have been discussed with the patient and/or family. After consideration of risks, benefits and other options for treatment, the patient has consented to  the endoscopic procedure(s).   The patient's history has been reviewed, patient examined, no change in status, stable for endoscopic procedure(s).  I have reviewed the patient's chart and labs.  Questions were answered to the patient's satisfaction.     Lupita [name], MD, NOLIA

## 2024-01-14 NOTE — Progress Notes (Signed)
 History and Physical Interval Note:  01/14/2024 8:03 AM  Elizabeth Gentry  has presented today for endoscopic procedure(s), with the diagnosis of  Encounter Diagnosis  Name Primary?   Heme + stool Yes  .  The various methods of evaluation and treatment have been discussed with the patient and/or family. After consideration of risks, benefits and other options for treatment, the patient has consented to  the endoscopic procedure(s).   The patient's history has been reviewed, patient examined, no change in status, stable for endoscopic procedure(s).  I have reviewed the patient's chart and labs.  Questions were answered to the patient's satisfaction.     Lupita [name], MD, NOLIA

## 2024-01-14 NOTE — Telephone Encounter (Signed)
 Incoming call by patient to cancel the upcoming appointments. Patients states her "problem" was identified by another provider and wishes to have referral closed.

## 2024-01-15 ENCOUNTER — Telehealth: Payer: Self-pay | Admitting: *Deleted

## 2024-01-15 NOTE — Telephone Encounter (Signed)
  Follow up Call-     01/14/2024    7:13 AM 01/07/2023    7:14 AM  Call back number  Post procedure Call Back phone  # [phone]  Permission to leave phone message Yes Yes     Patient questions:  Do you have a fever, pain , or abdominal swelling? No. Pain Score  0 *  Have you tolerated food without any problems? Yes.    Have you been able to return to your normal activities? Yes.    Do you have any questions about your discharge instructions: Diet   No. Medications  No. Follow up visit  No.  Do you have questions or concerns about your Care? No.  Actions: * If pain score is 4 or above: No action needed, pain <4.

## 2024-01-18 LAB — SURGICAL PATHOLOGY

## 2024-01-20 ENCOUNTER — Encounter: Payer: Self-pay | Admitting: Internal Medicine

## 2024-01-21 ENCOUNTER — Other Ambulatory Visit: Payer: 59

## 2024-01-21 ENCOUNTER — Encounter: Payer: 59 | Admitting: Nurse Practitioner

## 2024-02-11 ENCOUNTER — Ambulatory Visit: Payer: 59 | Admitting: Orthopaedic Surgery

## 2024-02-18 ENCOUNTER — Ambulatory Visit: Admitting: Orthopaedic Surgery

## 2024-02-18 DIAGNOSIS — M79672 Pain in left foot: Secondary | ICD-10-CM

## 2024-02-18 MED ORDER — METHYLPREDNISOLONE ACETATE 40 MG/ML IJ SUSP
40.0000 mg | INTRAMUSCULAR | Status: AC | PRN
  Administered 2024-02-18: 40 mg

## 2024-02-18 MED ORDER — LIDOCAINE HCL 1 % IJ SOLN
1.0000 mL | INTRAMUSCULAR | Status: AC | PRN
  Administered 2024-02-18: 1 mL

## 2024-02-18 MED ORDER — BUPIVACAINE HCL 0.5 % IJ SOLN
1.0000 mL | INTRAMUSCULAR | Status: AC | PRN
  Administered 2024-02-18: 1 mL

## 2024-02-18 NOTE — Progress Notes (Signed)
 Office Visit Note   Patient: Elizabeth Gentry           Date of Birth: October 05, 1961           MRN: [id number] Visit Date: 02/18/2024              Requested by: Merri Brunette, MD [phone] [address] [place],  [place] [address] PCP: Merri Brunette, MD   Assessment & Plan: Visit Diagnoses:  1. Pain of left heel     Plan: Taneika is a 63 year old female with recurrent left plantar fasciitis.  Injection repeated today.  Continue with home exercises and splints.  Follow-up as needed.  Follow-Up Instructions: No follow-ups on file.   Orders:  No orders of the defined types were placed in this encounter.  No orders of the defined types were placed in this encounter.     Procedures: Foot Inj: left plantar fascia  Date/Time: 02/18/2024 9:48 AM  Performed by: Tarry Kos, MD Authorized by: Tarry Kos, MD   Consent Given by:  Patient Timeout: prior to procedure the correct patient, procedure, and site was verified   Indications:  Pain Condition: Plantar Fasciitis   Location: left plantar fascia muscle   Prep: patient was prepped and draped in usual sterile fashion   Needle Size:  25 G Medications:  1 mL lidocaine 1 %; 1 mL bupivacaine 0.5 %; 40 mg methylPREDNISolone acetate 40 MG/ML    Clinical Data: No additional findings.   Subjective: Chief Complaint  Patient presents with   Other    Left heel pain-wants injection    HPI [name] returns today for follow-up evaluation of left plantar fasciitis.  The previous injection helped for about 2 and half months.  She is going to [place] soon and would like another injection. Review of Systems   Objective: Vital Signs: There were no vitals taken for this visit.  Physical Exam  Ortho Exam Exam is unchanged from prior visit Specialty Comments:  No specialty comments available.  Imaging: No results found.   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 01/14/2024   Depression    Family history of breast cancer    Family history of colon cancer    Family history of melanoma    Family history of ovarian cancer    Family history of prostate cancer    Hashimoto's disease    Hyperlipidemia    Thyroid disease     Family History  Problem Relation Age of Onset   Cancer Mother        body full of cancer per patient   Ovarian cancer Mother 1   Diabetes Mother    Hypertension Mother    Hyperlipidemia Mother    Breast cancer Mother 52   Heart disease Mother    Melanoma  Mother 77   Lung cancer Father    Colon polyps Father    Prostate cancer Father 16   Breast cancer Maternal Grandmother 103   Heart disease Maternal Grandfather 83   Colon cancer Paternal Grandfather 73   Lung cancer Maternal Uncle 2   Lung cancer Paternal Aunt 79   Liver cancer Paternal Uncle    Liver cancer Cousin 55       mat first cousin   Stomach cancer Neg Hx    Esophageal cancer Neg Hx    Liver disease Neg Hx    Rectal cancer Neg Hx    Pancreatic cancer Neg Hx    Crohn's disease Neg Hx    Ulcerative colitis Neg Hx     Past Surgical History:  Procedure Laterality Date   ADENOIDECTOMY     COLONOSCOPY     KNEE SURGERY Right    MYRINGOTOMY     REDUCTION MAMMAPLASTY Bilateral 2016   Breast lift   uterine ablation- 2010  2010   dr holland for AUB   Social History   Occupational History   Not on file   Tobacco Use   Smoking status: Never   Smokeless tobacco: Never  Vaping Use   Vaping status: Never Used  Substance and Sexual Activity   Alcohol use: Yes    Alcohol/week: 3.0 standard drinks of alcohol    Types: 3 Glasses of wine per week    Comment: WINE 1 X PER WEEK   Drug use: No   Sexual activity: Not on file

## 2024-05-18 ENCOUNTER — Other Ambulatory Visit: Payer: Self-pay | Admitting: Family Medicine

## 2024-05-18 DIAGNOSIS — Z1231 Encounter for screening mammogram for malignant neoplasm of breast: Secondary | ICD-10-CM

## 2024-05-20 ENCOUNTER — Ambulatory Visit (INDEPENDENT_AMBULATORY_CARE_PROVIDER_SITE_OTHER): Admitting: Orthopaedic Surgery

## 2024-05-20 DIAGNOSIS — M79672 Pain in left foot: Secondary | ICD-10-CM | POA: Diagnosis not present

## 2024-05-20 NOTE — Progress Notes (Signed)
 Patient: Elizabeth Gentry           Date of Birth: [birth year]           MRN: [id number] Visit Date: 05/20/2024 PCP: Faustina Hood, MD   Assessment & Plan:  Chief Complaint:  Chief Complaint  Patient presents with   Left Heel - Pain, Follow-up   Visit Diagnoses:  1. Pain of left heel     Plan: History of Present Illness Elizabeth Gentry is a 63 year old female who presents with persistent foot pain due to chronic plantar fasciitis.  She has experienced plantar fasciitis for nine months, with no full resolution. Two corticosteroid injections provided temporary relief, with the first being more effective. Pain is constant, with throbbing at night and occasional electric shock-like sensations. She describes a sensation of walking on a heel spur. An x-ray confirmed a heel spur. Non-surgical treatments have included a night splint, a sock to dorsiflex the foot, and exercises with bands. The pain significantly affects her daily activities, and she wishes to be pain-free for her nephew's wedding in September.  Exam of left foot is unchanged from prior visit.  Assessment and Plan Plantar fasciitis, left foot Chronic plantar fasciitis with persistent pain despite previous injections and conservative treatments. Heel spur noted on x-ray. Prefers non-surgical options; surgery considered last resort. - Refer to Dr. Shauna Del for shockwave therapy consultation. - Provide referrals to Dr. Vickki Grandchild at [facility] and Dr. Amada Backer at [facility] for further evaluation and potential surgical consultation if necessary.  Follow-Up Instructions: No follow-ups on file.   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 01/14/2024   Depression    Family history of breast cancer    Family history of colon cancer    Family history of melanoma    Family history of ovarian cancer    Family history of prostate cancer    Hashimoto's disease    Hyperlipidemia    Thyroid  disease     Family History  Problem Relation Age of Onset   Cancer Mother        body full of cancer per patient   Ovarian cancer Mother 48   Diabetes Mother    Hypertension Mother    Hyperlipidemia Mother    Breast cancer Mother 12   Heart disease Mother    Melanoma Mother 40   Lung cancer Father    Colon polyps Father    Prostate cancer Father 104   Breast cancer Maternal Grandmother 19   Heart disease Maternal Grandfather 49   Colon cancer Paternal Grandfather 3   Lung cancer Maternal Uncle 53   Lung cancer Paternal Aunt 52   Liver cancer Paternal Uncle    Liver cancer Cousin 55       mat first cousin  Stomach cancer Neg Hx    Esophageal cancer Neg Hx    Liver disease Neg Hx    Rectal cancer Neg Hx    Pancreatic cancer Neg Hx    Crohn's disease Neg Hx    Ulcerative colitis Neg Hx     Past Surgical History:  Procedure Laterality Date   ADENOIDECTOMY     COLONOSCOPY     KNEE SURGERY Right    MYRINGOTOMY     REDUCTION MAMMAPLASTY Bilateral 2016   Breast lift   uterine ablation- 2010  2010   dr holland for AUB   Social History   Occupational History   Not on file  Tobacco Use   Smoking status: Never   Smokeless tobacco: Never  Vaping Use   Vaping status: Never Used  Substance and Sexual Activity   Alcohol use: Yes    Alcohol/week: 3.0 standard drinks of alcohol    Types: 3  Glasses of wine per week    Comment: WINE 1 X PER WEEK   Drug use: No   Sexual activity: Not on file

## 2024-05-30 ENCOUNTER — Encounter: Payer: Self-pay | Admitting: Sports Medicine

## 2024-05-30 ENCOUNTER — Ambulatory Visit: Admitting: Sports Medicine

## 2024-05-30 DIAGNOSIS — M2142 Flat foot [pes planus] (acquired), left foot: Secondary | ICD-10-CM

## 2024-05-30 DIAGNOSIS — M722 Plantar fascial fibromatosis: Secondary | ICD-10-CM | POA: Diagnosis not present

## 2024-05-30 DIAGNOSIS — M2141 Flat foot [pes planus] (acquired), right foot: Secondary | ICD-10-CM | POA: Diagnosis not present

## 2024-05-30 NOTE — Progress Notes (Signed)
 Elizabeth Gentry - 63 y.o. female MRN [id number]  Date of birth: August 14, 1961  Office Visit Note: Visit Date: 05/30/2024 PCP: Claudene Pellet, MD Referred by: Claudene Pellet, MD  Subjective: Chief Complaint  Patient presents with   Left Heel - Pain   HPI: Elizabeth Gentry is a very pleasant 63 y.o. female who presents today for acute on chronic left heel/plantar fasciitis.  She has had chronic left heel pain with plantar fascia pain since September of last year.  No inciting event.  She has she had plantar fascia on the contralateral foot but was able to heal and resolve this well with home exercises/stretching and other conservative treatments.  She has had 2 separate plantar fascia injections on this left side which gave her about 50% relief taking her pain from a 4/10 down to a 2/10 but this only lasted for about 2 months before her pain returned.  She has used oral anti-inflammatories only as needed but does not take anything consistently as this has not been significantly helpful.  Treatments tried thus far: Shoewear modifications, orthopedic shoe, [place] sock at night, stretching and home exercises with stretch band, ice, rolling the foot with a ball, night splint.  Pertinent ROS were reviewed with the patient and found to be negative unless otherwise specified above in HPI.   Assessment & Plan: Visit Diagnoses:  1. Plantar fasciitis of left foot   2. Pes planus of both feet    Plan: Impression is chronic and recalcitrant left heel pain with plantar fasciitis.  She has been dealing with this since September and has not had resolution and only temporary relief with a number of conservative treatment options including 2 previous corticosteroid injections.  Through shared decision making, we did proceed with a trial of extracorporeal shockwave therapy, patient tolerated well.  I would like to see her back next week for repeat treatment and further evaluation.  After 2-3 treatments we  will see what sort of cumulative benefit she has going forward before deciding on additional.  I would like her to continue her stretching and home exercises to support the plantar fascia, I did provide my own handout which she will add to her regimen.  Okay for over-the-counter anti-inflammatories only as needed.  We did discuss the role for orthotics and longitudinal arch support given her concomitant pes planus, she will bring her tennis shoes at next visit and we can consider fitting her for these or sending her for custom orthotics.  Follow-up: Return in about 1 week (around 06/06/2024).   Meds & Orders: No orders of the defined types were placed in this encounter.  No orders of the defined types were placed in this encounter.    Procedures: No procedures performed      Clinical History: No specialty comments available.  She reports that she has never smoked. She has never used smokeless tobacco. No results for input(s): HGBA1C, LABURIC in the last 8760 hours.  Objective:    Physical Exam  Gen: Well-appearing, in no acute distress; non-toxic CV: Well-perfused. Warm.  Resp: Breathing unlabored on room air; no wheezing. Psych: Fluid speech in conversation; appropriate affect; normal thought process  Ortho Exam - Left foot/ankle: + TTP at the insertion over the medial band of the plantar fascia in the medial calcaneus.  There is no tenderness underneath the plantar calcaneus near the heel spur nor lateral calcaneal TTP.  No redness swelling or effusion here.  There is preserved plantarflexion and dorsiflexion.  There is moderate pes planus noted with hyperpronation right greater than left of bilateral feet through stance phase.  Imaging:  11/27/23: XR [facility] Left X-rays of the left heel show a small plantar calcaneal spur.  No acute  abnormalities.  Past Medical/Family/Surgical/Social History: Medications & Allergies reviewed per EMR, new medications updated. 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 01/14/2024   Depression    Family history of breast cancer    Family history of colon cancer    Family history of melanoma    Family history of ovarian cancer    Family history of prostate cancer    Hashimoto's disease    Hyperlipidemia    Thyroid  disease    Family History  Problem Relation Age of Onset   Cancer Mother        body full of cancer per patient   Ovarian cancer Mother 33   Diabetes Mother    Hypertension Mother    Hyperlipidemia Mother    Breast cancer Mother 82   Heart disease Mother    Melanoma Mother 42   Lung cancer Father    Colon polyps Father    Prostate cancer Father 37   Breast cancer Maternal Grandmother 19   Heart disease Maternal Grandfather 14   Colon cancer Paternal Grandfather 74   Lung cancer Maternal Uncle 18   Lung cancer Paternal Aunt 74   Liver cancer Paternal Uncle    Liver cancer Cousin 55       mat first cousin   Stomach cancer Neg Hx    Esophageal cancer Neg Hx    Liver disease Neg Hx    Rectal cancer Neg Hx    Pancreatic cancer Neg Hx    Crohn's disease Neg Hx    Ulcerative colitis Neg Hx    Past Surgical History:  Procedure Laterality Date   ADENOIDECTOMY     COLONOSCOPY     KNEE SURGERY Right    MYRINGOTOMY     REDUCTION MAMMAPLASTY Bilateral 2016   Breast lift   uterine ablation- 2010  2010   dr holland  for [facility]UB   Social History   Occupational History   Not on file  Tobacco Use   Smoking status: Never   Smokeless tobacco: Never  Vaping Use   Vaping status: Never Used  Substance and Sexual Activity   Alcohol use: Yes    Alcohol/week: 3.0 standard drinks of alcohol    Types: 3 Glasses of wine per week    Comment: WINE 1 X PER WEEK   Drug use: No   Sexual activity: Not on file   I spent 33 minutes in the care of the patient today including face-to-face time, preparation to see the patient, as well as independent review and interpretation of heel x-rays, chart review regarding previous treatment  options, overview and treatment time spent with extracorporeal shockwave therapy, handout and home exercises, gait analysis and insole/orthotic modification discussed for the above diagnoses.   Lonell Sprang, DO Primary Care Sports Medicine Physician  [facility] - Orthopedics  This note was dictated using Dragon naturally speaking software and may contain errors in syntax, spelling, or content which have not been identified prior to signing this note.

## 2024-05-30 NOTE — Progress Notes (Signed)
 Patient says that she has had chronic left heel pain since September of last year. She says that her pain has remained in the bottom of the heel. She has tried two injections, which gave her partial relief for about 8 weeks at a time. She does not take oral medication, and would prefer not to take medication. She says that she has tried various home treatments, including an orthopedic shoe at night, a [place] sock, stretch bands, ice, and rolling the foot on a ball. She has gotten the most relief with the orthopedic shoe. She would like to try conservative treatments, with any invasive treatments being the last resort.

## 2024-06-06 ENCOUNTER — Encounter: Payer: Self-pay | Admitting: Sports Medicine

## 2024-06-06 ENCOUNTER — Ambulatory Visit (INDEPENDENT_AMBULATORY_CARE_PROVIDER_SITE_OTHER): Admitting: Sports Medicine

## 2024-06-06 DIAGNOSIS — M722 Plantar fascial fibromatosis: Secondary | ICD-10-CM | POA: Diagnosis not present

## 2024-06-06 DIAGNOSIS — M2141 Flat foot [pes planus] (acquired), right foot: Secondary | ICD-10-CM | POA: Diagnosis not present

## 2024-06-06 DIAGNOSIS — M2142 Flat foot [pes planus] (acquired), left foot: Secondary | ICD-10-CM | POA: Diagnosis not present

## 2024-06-06 NOTE — Progress Notes (Signed)
 Elizabeth Gentry - 63 y.o. female MRN [id number]  Date of birth: May 27, 1961  Office Visit Note: Visit Date: 06/06/2024 PCP: Claudene Pellet, MD Referred by: Claudene Pellet, MD  Subjective: Chief Complaint  Patient presents with   Left Foot - Pain    Patient comes in today for Shock wave therapy. States 1st session made it feel better. Does not take anything for pain.    HPI: Elizabeth Gentry is a pleasant 63 y.o. female who presents today for follow-up of left foot plantar fasciitis.  As a reminder, she has had 2 separate plantar fascia injections on this left side which gave her about 50% relief but did not fully relieve her pain.  At last visit, we did proceed with our first trial of extracorporeal shockwave therapy which she notes a positive response to and initially had a good amount of pain relief.  Following the treatment her pain slightly returned but she did notice an improvement in terms of pain with first steps.  She did bring her tennis shoes and to be evaluated for orthotics.  Pertinent ROS were reviewed with the patient and found to be negative unless otherwise specified above in HPI.   Assessment & Plan: Visit Diagnoses:  1. Plantar fasciitis of left foot   2. Pes planus of both feet    Plan: Impression is chronic and recalcitrant left heel plantar fasciitis.  We did perform 1 treatment of extracorporeal shockwave therapy and she did have a positive response to this.  We did repeat ESWT treatment today, patient tolerated well.  We will plan on repeating a third treatment next week and then see what sort of cumulative benefit she has going forward.  She will continue her home exercises on a daily to BID basis.  We did fit her for over-the-counter orthotics today which did help improve her gait and prevent hyperpronation.  We will see over the next week how this feels in her tennis shoes.  She could be a candidate for a more permanent custom orthotic going forward if needed.   Follow-up in 1 week.  Okay for over-the-counter anti-inflammatories only as needed.  *She is okay regarding the cost of orthotics that she will purchase at the visit today.  Follow-up: Return in about 1 week (around 06/13/2024).   Meds & Orders: No orders of the defined types were placed in this encounter.  No orders of the defined types were placed in this encounter.    Procedures: Procedure: ECSWT Indications:  Plantar fasciitis   Procedure Details Consent: Risks of procedure as well as the alternatives and risks of each were explained to the patient.  Verbal consent for procedure obtained. Time Out: Verified patient identification, verified procedure, site was marked, verified correct patient position. The area was cleaned with alcohol swab.     The left plantar fascia was targeted for Extracorporeal shockwave therapy.    Preset: Plantar fasciitis  Power Level: 100 mJ Frequency: 12 Hz Impulse/cycles: 2800 Head size: Regular   Patient tolerated procedure well without immediate complications.       Clinical History: No specialty comments available.  She reports that she has never smoked. She has never used smokeless tobacco. No results for input(s): HGBA1C, LABURIC in the last 8760 hours.  Objective:    Physical Exam  Gen: Well-appearing, in no acute distress; non-toxic CV: Well-perfused. Warm.  Resp: Breathing unlabored on room air; no wheezing. Psych: Fluid speech in conversation; appropriate affect; normal thought process  Ortho Exam -  Left foot: + TTP at the insertion of the medial band of the plantar fascia but improved from previous visit.  There is no redness swelling or effusion of the ankle joint.  There is moderate pes planus noted with hyperpronation right greater than left.  We did evaluate this after placing orthotics in her tennis shoes with a more neutral gait and only slight pronation on the right side.  Imaging: No results found.  Past  Medical/Family/Surgical/Social History: Medications & Allergies reviewed per EMR, new medications updated. 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 01/14/2024   Depression    Family history of breast cancer    Family history of colon cancer    Family history of melanoma    Family history of ovarian cancer    Family history of prostate cancer    Hashimoto's disease    Hyperlipidemia    Thyroid  disease    Family History  Problem Relation Age of Onset   Cancer Mother        body full of cancer per patient   Ovarian cancer Mother 84   Diabetes Mother    Hypertension Mother    Hyperlipidemia Mother    Breast cancer Mother 57   Heart disease Mother    Melanoma Mother 51   Lung cancer Father    Colon polyps Father    Prostate cancer Father 76   Breast cancer Maternal Grandmother 33   Heart disease Maternal Grandfather 46   Colon cancer Paternal Grandfather 32   Lung cancer Maternal Uncle 38   Lung cancer Paternal Aunt 15   Liver cancer Paternal Uncle    Liver cancer Cousin 55       mat first cousin   Stomach cancer Neg Hx    Esophageal cancer Neg Hx    Liver disease Neg Hx    Rectal cancer Neg Hx    Pancreatic cancer Neg Hx    Crohn's disease Neg Hx    Ulcerative colitis Neg Hx    Past Surgical History:  Procedure Laterality Date   ADENOIDECTOMY     COLONOSCOPY     KNEE SURGERY Right     MYRINGOTOMY     REDUCTION MAMMAPLASTY Bilateral 2016   Breast lift   uterine ablation- 2010  2010   dr holland for AUB   Social History   Occupational History   Not on file  Tobacco Use   Smoking status: Never   Smokeless tobacco: Never  Vaping Use   Vaping status: Never Used  Substance and Sexual Activity   Alcohol use: Yes    Alcohol/week: 3.0 standard drinks of alcohol    Types: 3 Glasses of wine per week    Comment: WINE 1 X PER WEEK   Drug use: No   Sexual activity: Not on file

## 2024-06-13 ENCOUNTER — Encounter: Payer: Self-pay | Admitting: Sports Medicine

## 2024-06-13 ENCOUNTER — Ambulatory Visit (INDEPENDENT_AMBULATORY_CARE_PROVIDER_SITE_OTHER): Admitting: Sports Medicine

## 2024-06-13 DIAGNOSIS — M2142 Flat foot [pes planus] (acquired), left foot: Secondary | ICD-10-CM | POA: Diagnosis not present

## 2024-06-13 DIAGNOSIS — M722 Plantar fascial fibromatosis: Secondary | ICD-10-CM | POA: Diagnosis not present

## 2024-06-13 DIAGNOSIS — M2141 Flat foot [pes planus] (acquired), right foot: Secondary | ICD-10-CM

## 2024-06-13 NOTE — Progress Notes (Signed)
 Patient says that she has overall noticed improvement since her initial shockwave appointment. She says that she did have to stand for about 3 hours on Friday, and was not wearing the best shoes, so she did feel she regressed some. She says that she worked hard over the weekend. She does her exercises daily which are going well, and she stretches every night before bed. She says that the arch support from last visit has helped.

## 2024-06-13 NOTE — Progress Notes (Signed)
 Elizabeth Gentry - 63 y.o. female MRN [id number]  Date of birth: Jul 05, 1961  Office Visit Note: Visit Date: 06/13/2024 PCP: Claudene Pellet, MD Referred by: Claudene Pellet, MD  Subjective: Chief Complaint  Patient presents with   Left Foot - Follow-up   HPI: Elizabeth Gentry is a pleasant 63 y.o. female who presents today for follow-up of left foot plantar fasciitis and pes planus.  We have performed 2 treatments of extracorporeal shockwave therapy, she has noticed very good response to this.  She still has pain but certainly improved.  She was on her feet quite a bit more recently and did have a small setback with this but overall still improved.  She feels much more comfortable in her orthotics which we placed at last visit.  She does continue doing her stretches and rehab exercises every night before bed.  Pertinent ROS were reviewed with the patient and found to be negative unless otherwise specified above in HPI.   Assessment & Plan: Visit Diagnoses:  1. Plantar fasciitis of left foot   2. Pes planus of both feet    Plan: Impression is chronic left heel plantar fasciitis in the setting of pes planus.  She has responded well to extracorporeal shockwave therapy, we did repeat this treatment today.  She will continue her home exercises on a daily to twice daily basis.  She has much improved correction of her longitudinal arch loss and overpronation in her orthotics, she will wear these daily in all of her shoes.  When she is barefoot or wearing sandals, I did print out examples of a Pedifix arch strap that she may wear to help support the arch and foot.  Okay for over-the-counter anti-inflammatories as needed.  We will plan to repeat a few treatments of extracorporeal shockwave therapy to give her further cumulative benefit.  Could consider referral to [facility] for custom orthotics in future only if needed  Follow-up: F/u 1 week   Meds & Orders: No orders of the defined types were placed in  this encounter.  No orders of the defined types were placed in this encounter.    Procedures: Procedure: ECSWT Indications:  Plantar fasciitis   Procedure Details Consent: Risks of procedure as well as the alternatives and risks of each were explained to the patient.  Verbal consent for procedure obtained. Time Out: Verified patient identification, verified procedure, site was marked, verified correct patient position. The area was cleaned with alcohol swab.     The left plantar fascia was targeted for Extracorporeal shockwave therapy.    Preset: Plantar fasciitis  Power Level: 110 mJ Frequency: 13 Hz Impulse/cycles: 3200 Head size: Regular   Patient tolerated procedure well without immediate complications.      Clinical History: No specialty comments available.  She reports that she has never smoked. She has never used smokeless tobacco. No results for input(s): HGBA1C, LABURIC in the last 8760 hours.  Objective:    Physical Exam   Gen: Well-appearing, in no acute distress; non-toxic CV: Well-perfused. Warm.  Resp: Breathing unlabored on room air; no wheezing. Psych: Fluid speech in conversation; appropriate affect; normal thought process  Ortho Exam - Left foot: + TTP at the insertion of the medial band of the plantar fascia but no redness or swelling here.  She has moderate pes planus, but evaluating her in the orthotic shows a neutral gait without overpronation.  Imaging: No results found.  Past Medical/Family/Surgical/Social History: Medications & Allergies reviewed per EMR, new medications updated.  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 01/14/2024   Depression    Family history of breast cancer    Family history of colon cancer    Family history of melanoma    Family history of ovarian cancer    Family history of prostate cancer    Hashimoto's disease    Hyperlipidemia    Thyroid  disease    Family History  Problem Relation Age of Onset   Cancer Mother        body full of cancer per patient   Ovarian cancer Mother 55   Diabetes Mother    Hypertension Mother    Hyperlipidemia Mother    Breast cancer Mother 44   Heart disease Mother    Melanoma Mother 25   Lung cancer Father    Colon polyps Father    Prostate cancer Father 45   Breast cancer Maternal Grandmother 73   Heart disease Maternal Grandfather 56   Colon cancer Paternal Grandfather 49   Lung cancer Maternal Uncle 71   Lung cancer Paternal Aunt 46   Liver cancer Paternal Uncle    Liver cancer Cousin 55       mat first cousin   Stomach cancer Neg Hx    Esophageal cancer Neg Hx    Liver disease Neg Hx    Rectal cancer Neg Hx    Pancreatic cancer Neg Hx    Crohn's disease Neg Hx    Ulcerative colitis Neg Hx    Past Surgical History:  Procedure Laterality Date   ADENOIDECTOMY     COLONOSCOPY     KNEE SURGERY Right    MYRINGOTOMY     REDUCTION MAMMAPLASTY Bilateral 2016   Breast lift   uterine ablation- 2010  2010   dr holland for AUB   Social History   Occupational History   Not on file  Tobacco Use   Smoking status: Never   Smokeless tobacco: Never  Vaping Use   Vaping status: Never Used  Substance and Sexual Activity   Alcohol use: Yes    Alcohol/week: 3.0 standard drinks of alcohol    Types: 3 Glasses of wine per week    Comment: WINE 1 X PER WEEK   Drug use:  No   Sexual activity: Not on file

## 2024-06-21 ENCOUNTER — Ambulatory Visit (INDEPENDENT_AMBULATORY_CARE_PROVIDER_SITE_OTHER): Admitting: Sports Medicine

## 2024-06-21 ENCOUNTER — Encounter: Payer: Self-pay | Admitting: Sports Medicine

## 2024-06-21 DIAGNOSIS — M2141 Flat foot [pes planus] (acquired), right foot: Secondary | ICD-10-CM | POA: Diagnosis not present

## 2024-06-21 DIAGNOSIS — M2142 Flat foot [pes planus] (acquired), left foot: Secondary | ICD-10-CM | POA: Diagnosis not present

## 2024-06-21 DIAGNOSIS — M722 Plantar fascial fibromatosis: Secondary | ICD-10-CM | POA: Diagnosis not present

## 2024-06-21 NOTE — Progress Notes (Signed)
 Patient reports of feeling more than a third better. At home exercises are going well and she reports no pain with those. Patient ordered the arch traps and likes them. She wears them anytime she is not wearing sneakers. She states sometimes in the middle of the night she still has pain. After a really long day and feet are tired she sleeps in the arch straps.

## 2024-06-21 NOTE — Progress Notes (Signed)
 Elizabeth Gentry - 63 y.o. female MRN [id number]  Date of birth: October 29, 1961  Office Visit Note: Visit Date: 06/21/2024 PCP: Claudene Pellet, MD Referred by: Claudene Pellet, MD  Medical Resident, Sports Medicine Fellow - Attending Physician Addendum:   I have independently interviewed and examined the patient myself. I have discussed the above with the original author and agree with their documentation. My edits for correction/addition/clarification have been made, see any changes above and below.   In summary, slowly improving recalcitrant plantar fasciitis -shockwave therapy has been helpful however compared to previous treatments which have been ineffective.  We did repeat shockwave therapy today, patient tolerated well.  She will continue her orthotics in her longitudinal arch straps for gait correction and arch support.  We will bring her back for 1 additional shockwave treatment and reevaluation.  Did discuss the option of PRP injection therapy as augmentation for her improvement. She will do some reading/research (will give handout next visit) and consider this going forward.  Lonell Sprang, DO Primary Care Sports Medicine Physician  [facility] - Orthopedics   Subjective: Chief Complaint  Patient presents with   Left Foot - Follow-up   HPI: Elizabeth Gentry is a pleasant 63 y.o. female who presents today for treatment of plantar fasciitis of her left foot.  She has undergone 3 previous treatments of extracorporeal shockwave therapy and notes a 33-35% improvement of pain since initiating shockwave therapy.  Previously undergone 2 cortisone injections plantar fascia with temporary pain but after 8 weeks she reports pain would come back vengeance.  Current at home therapy includes heel orthotic, PT exercises including towel crunches, picking up marbles, plantar fascia stretching and massage, rolling plantar fascia over frozen lime, or tennis ball.  She is also tried plantar  fascia sock during sleep at night with no benefit.  Reports I have tried everything and shockwave has been best therapy so far.  Still has tenderness to palpation over medial calcaneus where insertion of plantar fascia occurs. First step in the morning is still most painful.   Pertinent ROS were reviewed with the patient and found to be negative unless otherwise specified above in HPI.   Assessment & Plan: Visit Diagnoses:  1. Plantar fasciitis of left foot   2. Pes planus of both feet    Plan: Left plantar fascia slowly beginning to improve.  Completed fourth round of shockwave therapy for left plantar fasciitis today.  Patient notes at least 1/3rd improvement since initiating therapy.  Has undergone 2 corticosteroid injections in the past with minimal benefit; not interested in repeating these injections moving forward.  Will continue with shockwave therapy; patient is scheduled for follow-up appointment next Monday.  After fifth session next week we will determine whether patient is still progressing or whether we need to seek alternative therapy.  Discussed PRP with patient in room.  She will consider this.  Discussed chronicity of plantar fasciitis and how sometimes it will take 12-18 months for full resolution of pain. September 2025 will mark 1 year since pain started. Also discussed additional treatments available if she does not respond to therapies described above including referral for surgery.  She is not interested in surgical intervention at this time and does not wish to discuss further.  Follow-up: Return in about 1 week (around 06/28/2024).   Meds & Orders: No orders of the defined types were placed in this encounter.  No orders of the defined types were placed in this encounter.  Procedures:  Procedures: Procedure: ECSWT Indications:  Plantar fasciitis   Procedure Details Consent: Risks of procedure as well as the alternatives and risks of each were explained to the  patient.  Verbal consent for procedure obtained. Time Out: Verified patient identification, verified procedure, site was marked, verified correct patient position. The area was cleaned with alcohol swab.     The left plantar fascia was targeted for Extracorporeal shockwave therapy.    Preset: Plantar fasciitis  Power Level: 110-120 mJ Frequency: 13 Hz Impulse/cycles: 3000 Head size: Regular     Clinical History: No specialty comments available.  She reports that she has never smoked. She has never used smokeless tobacco. No results for input(s): HGBA1C, LABURIC in the last 8760 hours.  Objective:    Physical Exam  Gen: Well-appearing, in no acute distress; non-toxic CV: Well-perfused. Warm.  Resp: Breathing unlabored on room air; no wheezing. Psych: Fluid speech in conversation; appropriate affect; normal thought process  Ortho Exam - TTP on medial aspect of calcaneus  -negative windlass today -adequate ankle dorsiflexion -pes planus; formation of callus over medial aspect of heel   Imaging: No results found.  Past Medical/Family/Surgical/Social History: Medications & Allergies reviewed per EMR, new medications updated. 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 01/14/2024   Depression    Family history of breast cancer    Family history of colon cancer    Family history of melanoma     Family history of ovarian cancer    Family history of prostate cancer    Hashimoto's disease    Hyperlipidemia    Thyroid  disease    Family History  Problem Relation Age of Onset   Cancer Mother        body full of cancer per patient   Ovarian cancer Mother 67   Diabetes Mother    Hypertension Mother    Hyperlipidemia Mother    Breast cancer Mother 10   Heart disease Mother    Melanoma Mother 11   Lung cancer Father    Colon polyps Father    Prostate cancer Father 63   Breast cancer Maternal Grandmother 31   Heart disease Maternal Grandfather 16   Colon cancer Paternal Grandfather 6   Lung cancer Maternal Uncle 25   Lung cancer Paternal Aunt 73   Liver cancer Paternal Uncle    Liver cancer Cousin 55       mat first cousin   Stomach cancer Neg Hx    Esophageal cancer Neg Hx    Liver disease Neg Hx    Rectal cancer Neg Hx    Pancreatic cancer Neg Hx    Crohn's disease Neg Hx    Ulcerative colitis Neg Hx    Past Surgical History:  Procedure Laterality Date   ADENOIDECTOMY     COLONOSCOPY  KNEE SURGERY Right    MYRINGOTOMY     REDUCTION MAMMAPLASTY Bilateral 2016   Breast lift   uterine ablation- 2010  2010   dr holland for AUB   Social History   Occupational History   Not on file  Tobacco Use   Smoking status: Never   Smokeless tobacco: Never  Vaping Use   Vaping status: Never Used  Substance and Sexual Activity   Alcohol use: Yes    Alcohol/week: 3.0 standard drinks of alcohol    Types: 3 Glasses of wine per week    Comment: WINE 1 X PER WEEK   Drug use: No   Sexual activity: Not on file

## 2024-06-27 ENCOUNTER — Ambulatory Visit (INDEPENDENT_AMBULATORY_CARE_PROVIDER_SITE_OTHER): Admitting: Sports Medicine

## 2024-06-27 ENCOUNTER — Encounter: Payer: Self-pay | Admitting: Sports Medicine

## 2024-06-27 DIAGNOSIS — M722 Plantar fascial fibromatosis: Secondary | ICD-10-CM | POA: Diagnosis not present

## 2024-06-27 DIAGNOSIS — M2142 Flat foot [pes planus] (acquired), left foot: Secondary | ICD-10-CM

## 2024-06-27 DIAGNOSIS — M2141 Flat foot [pes planus] (acquired), right foot: Secondary | ICD-10-CM

## 2024-06-27 NOTE — Progress Notes (Signed)
 Elizabeth Gentry - 63 y.o. female MRN [id number]  Date of birth: Apr 13, 1961  Office Visit Note: Visit Date: 06/27/2024 PCP: Claudene Pellet, MD Referred by: Claudene Pellet, MD  Subjective: Chief Complaint  Patient presents with   Left Foot - Follow-up   HPI: Elizabeth Gentry is a pleasant 63 y.o. female who presents today for follow-up of left foot plantar fasciitis.  Spring noticed a positive difference after her last extracorporeal shockwave treatment.  We have performed 4 total treatments and she does feel she is at least 50% improved at this time.  She has been very consistent with her plantar fascia home rehab exercises and stretching, continues in her orthotics as well in her shoewear.  She states she was able to wear a pair of wedges the other day which was the first time in a while being able to do so without pain.  Pertinent ROS were reviewed with the patient and found to be negative unless otherwise specified above in HPI.   Assessment & Plan: Visit Diagnoses:  1. Plantar fasciitis of left foot   2. Pes planus of both feet    Plan: Impression is chronic left heel pain with plantar fasciitis which has now improved about 50% or greater after multiple treatments of extracorporeal shockwave therapy, orthotic use, HEP.  Elizabeth Gentry and I are both pleased with the progress she has made recently, we did repeat extracorporeal shockwave therapy today, tolerated well.  She will continue her daily exercises with plantar fascia strengthening as well as stretching of the posterior cord.  Continue in orthotics and her arch straps to wear outside of shoes.  I did give her my handout regarding PRP injection therapy, given her improvement she would like to hold on this and continue shockwave in the interim.  We will have her make 2 separate appointments and then see at that time if we need additional treatments.  Follow-up over the next 1-2 weeks for repeat evaluation and shockwave therapy.   Follow-up:  Return for make 2 appts 1-week apart (ok for inj slot - Marquita Lias).   Meds & Orders: No orders of the defined types were placed in this encounter.  No orders of the defined types were placed in this encounter.    Procedures: Procedure: ECSWT Indications:  Plantar fasciitis   Procedure Details Consent: Risks of procedure as well as the alternatives and risks of each were explained to the patient.  Verbal consent for procedure obtained. Time Out: Verified patient identification, verified procedure, site was marked, verified correct patient position. The area was cleaned with alcohol swab.     The left plantar fascia was targeted for Extracorporeal shockwave therapy.    Preset: Plantar fasciitis  Power Level: 110-120 mJ  Frequency: 13 Hz Impulse/cycles: 3300 Head size: Regular      Clinical History: No specialty comments available.  She reports that she has never smoked. She has never used smokeless tobacco. No results for input(s): HGBA1C, LABURIC in the last 8760 hours.  Objective:    Physical Exam  Gen: Well-appearing, in no acute distress; non-toxic CV: Well-perfused. Warm.  Resp: Breathing unlabored on room air; no wheezing. Psych: Fluid speech in conversation; appropriate affect; normal thought process  Ortho Exam - Left foot: There is mild TTP with deep palpation over the medial band of the plantar fascia but certainly improved from previous visit.  Dorsiflexion to at least 95 degrees today.  No redness or swelling.  Imaging: No results found.  Past Medical/Family/Surgical/Social History:  Medications & Allergies reviewed per EMR, new medications updated. 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 deficiency anemia 01/14/2024   Depression    Family history of breast cancer    Family history of colon cancer    Family history of melanoma    Family history of ovarian cancer    Family history of prostate cancer    Hashimoto's disease    Hyperlipidemia    Thyroid  disease    Family History  Problem Relation Age of Onset   Cancer Mother        body full of cancer per patient   Ovarian cancer Mother 56   Diabetes Mother    Hypertension Mother    Hyperlipidemia Mother    Breast cancer Mother 39   Heart disease Mother    Melanoma Mother 79   Lung cancer Father    Colon polyps Father    Prostate cancer Father 28   Breast cancer Maternal Grandmother 45   Heart disease Maternal Grandfather 66   Colon cancer Paternal Grandfather 10   Lung cancer Maternal Uncle 78   Lung cancer Paternal Aunt 50   Liver cancer Paternal Uncle    Liver cancer Cousin 55       mat first cousin   Stomach cancer Neg Hx    Esophageal cancer Neg Hx    Liver disease Neg Hx    Rectal cancer Neg Hx    Pancreatic cancer Neg Hx    Crohn's disease Neg Hx    Ulcerative colitis Neg Hx    Past Surgical History:  Procedure Laterality Date   ADENOIDECTOMY     COLONOSCOPY     KNEE SURGERY Right    MYRINGOTOMY     REDUCTION MAMMAPLASTY Bilateral 2016   Breast lift   uterine ablation- 2010  2010   dr holland for AUB   Social History   Occupational History   Not on file  Tobacco Use   Smoking status: Never   Smokeless tobacco: Never  Vaping Use   Vaping status: Never Used  Substance and Sexual Activity   Alcohol use: Yes    Alcohol/week: 3.0 standard drinks of alcohol    Types: 3 Glasses of wine per  week    Comment: WINE 1 X PER WEEK   Drug use: No   Sexual activity: Not on file

## 2024-06-27 NOTE — Progress Notes (Signed)
 Patient says that she feels she is about 50% improved overall. She says that last treatment and the first treatment, later in the evening, the arch of her foot got itchy for an hour or two. She says that otherwise, she has not had any significant soreness or tenderness in the foot. She says that if the foot is going to bother her, it is at night, especially after being on her feet all day.

## 2024-07-04 ENCOUNTER — Ambulatory Visit
Admission: RE | Admit: 2024-07-04 | Discharge: 2024-07-04 | Disposition: A | Discharge status: Home / Self Care | Source: Ambulatory Visit | Attending: Family Medicine | Admitting: Family Medicine

## 2024-07-04 DIAGNOSIS — Z1231 Encounter for screening mammogram for malignant neoplasm of breast: Secondary | ICD-10-CM

## 2024-07-08 ENCOUNTER — Ambulatory Visit: Admitting: Sports Medicine

## 2024-07-08 ENCOUNTER — Encounter: Payer: Self-pay | Admitting: Sports Medicine

## 2024-07-08 DIAGNOSIS — M2141 Flat foot [pes planus] (acquired), right foot: Secondary | ICD-10-CM | POA: Diagnosis not present

## 2024-07-08 DIAGNOSIS — M2142 Flat foot [pes planus] (acquired), left foot: Secondary | ICD-10-CM

## 2024-07-08 DIAGNOSIS — M722 Plantar fascial fibromatosis: Secondary | ICD-10-CM | POA: Diagnosis not present

## 2024-07-08 NOTE — Progress Notes (Signed)
 Patient says that she feels she is trending in the right direction. She says that she was at a conference in [place] where she was on her feet for 12 hours on concrete. She says that this flared up her pain, but she does feel she is already recovering well.

## 2024-07-08 NOTE — Progress Notes (Signed)
 Elizabeth Gentry - 63 y.o. female MRN [id number]  Date of birth: 1961-11-28  Office Visit Note: Visit Date: 07/08/2024 PCP: Claudene Pellet, MD Referred by: Claudene Pellet, MD  Subjective: Chief Complaint  Patient presents with   Left Foot - Follow-up   HPI: Elizabeth Gentry is a pleasant 63 y.o. female who presents today for follow-up of chronic left heel pain with plantar fasciitis.  Elizabeth Gentry continues to notice improvement after extracorporeal shockwave treatment for her plantar fasciitis.  We did perform her last treatment about 2 weeks ago and for the first 5 days following treatment she had essentially very little pain.  She did have a 24-hour trip flight driving to [place] flying to [place] and then coming back to [place] -> [employer] which did have her on her feet a lot which slightly setback her pain, but overall she is still finding good improvement.  She continues in good supportive shoe wear using orthotics or arch strap as needed.  Pertinent ROS were reviewed with the patient and found to be negative unless otherwise specified above in HPI.   Assessment & Plan: Visit Diagnoses:  1. Plantar fasciitis of left foot   2. Pes planus of both feet    Plan: Impression is chronic left heel pain with plantar fasciitis which has made excellent strides with extracorporeal shockwave therapy, orthotics/arch strap use and home exercise regimen.  We did repeat extracorporeal shockwave therapy today.  She will continue her daily exercises for plantar fascia strengthening as well as stretching of the posterior heel cord. Given her improvements, she would like to continue shockwave therapy. She will follow-up next week for repeat treatment and further evaluation.  After this next treatment we will reevaluate the need for additional treatment versus taking a 4 to 6-week holiday and seeing what sort of long-term cumulative benefit she receives.  Okay for over-the-counter anti-inflammatories only as  needed.  Meds & Orders: No orders of the defined types were placed in this encounter.  No orders of the defined types were placed in this encounter.    Procedures: Procedure: ECSWT Indications:  Plantar fasciitis   Procedure Details Consent: Risks of procedure as well as the alternatives and risks of each were explained to the patient.  Verbal consent for procedure obtained. Time Out: Verified patient identification, verified procedure, site was marked, verified correct patient position. The area was cleaned with alcohol swab.     The left plantar fascia was targeted for Extracorporeal shockwave therapy.    Preset: Plantar fasciitis  Power Level: 120 mJ Frequency: 13-14 Hz Impulse/cycles: 3600 Head size: Regular      Clinical History: No specialty comments available.  She reports that she has never smoked. She has never used smokeless tobacco. No results for input(s): HGBA1C, LABURIC in the last 8760 hours.  Objective:    Physical Exam  Gen: Well-appearing, in no acute distress; non-toxic CV: Well-perfused. Warm.  Resp: Breathing unlabored on room air; no wheezing. Psych: Fluid speech in conversation; appropriate affect; normal thought process  Ortho Exam - Left foot: No redness swelling or effusion.  Mild TTP over the medial band of the plantar fascia.  Imaging: No results found.  Past Medical/Family/Surgical/Social History: Medications & Allergies reviewed per EMR, new medications updated. 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 iron deficiency anemia 01/14/2024   Depression    Family history of breast cancer    Family history of colon cancer    Family history of melanoma    Family history of ovarian cancer    Family history of prostate cancer    Hashimoto's disease    Hyperlipidemia    Thyroid  disease    Family History  Problem Relation Age of Onset   Cancer Mother        body full of cancer per patient   Ovarian cancer Mother 74   Diabetes Mother    Hypertension Mother    Hyperlipidemia Mother    Breast cancer Mother 39   Heart disease Mother    Melanoma Mother 16   Lung cancer Father    Colon polyps Father    Prostate cancer Father 26   Breast cancer Maternal Grandmother 30   Heart disease Maternal Grandfather 47   Colon cancer Paternal Grandfather 53   Lung cancer Maternal Uncle 30   Lung cancer Paternal Aunt 72   Liver cancer Paternal Uncle    Liver cancer Cousin 55       mat first cousin   Stomach cancer Neg Hx    Esophageal cancer Neg Hx    Liver disease Neg Hx    Rectal cancer Neg Hx    Pancreatic cancer Neg Hx    Crohn's disease Neg Hx    Ulcerative colitis Neg Hx    Past Surgical History:  Procedure Laterality Date   ADENOIDECTOMY     COLONOSCOPY     KNEE SURGERY Right    MYRINGOTOMY     REDUCTION MAMMAPLASTY Bilateral 2016   Breast lift   uterine ablation- 2010  2010   dr holland for AUB   Social History   Occupational History   Not on file  Tobacco Use   Smoking status: Never   Smokeless tobacco: Never  Vaping Use   Vaping status: Never Used  Substance and Sexual Activity   Alcohol use: Yes    Alcohol/week: 3.0 standard drinks of alcohol    Types: 3 Glasses of wine per week    Comment: WINE 1 X PER WEEK   Drug use: No   Sexual activity: Not on file

## 2024-07-15 ENCOUNTER — Encounter: Payer: Self-pay | Admitting: Sports Medicine

## 2024-07-15 ENCOUNTER — Ambulatory Visit: Admitting: Sports Medicine

## 2024-07-15 DIAGNOSIS — M722 Plantar fascial fibromatosis: Secondary | ICD-10-CM

## 2024-07-15 DIAGNOSIS — M2142 Flat foot [pes planus] (acquired), left foot: Secondary | ICD-10-CM

## 2024-07-15 DIAGNOSIS — M2141 Flat foot [pes planus] (acquired), right foot: Secondary | ICD-10-CM | POA: Diagnosis not present

## 2024-07-15 NOTE — Progress Notes (Signed)
 Elizabeth Gentry - 63 y.o. female MRN [id number]  Date of birth: [birth year]  Office Visit Note: Visit Date: 07/15/2024 PCP: Claudene Pellet, MD Referred by: Claudene Pellet, MD  Subjective: Chief Complaint  Patient presents with   Left Foot - Follow-up   HPI: Elizabeth Gentry is a pleasant 63 y.o. female who presents today for follow-up of chronic left foot pain with PF and bilateral pes planus.  Loyal is quite pleased with her degree of improvement, although does acknowledge she has been dealing with her plantar fasciitis for about 11 months.  She has responded very well to extracorporeal shockwave therapy and has been very consistent with her home exercise regimen.  She does use house shoes at night as she avoids barefoot.  She also has used Strassburg sock at times as well.  In general she is about 75% improved or more.  Pertinent ROS were reviewed with the patient and found to be negative unless otherwise specified above in HPI.   Assessment & Plan: Visit Diagnoses:  1. Plantar fasciitis of left foot   2. Pes planus of both feet    Plan: Impression is significantly improving chronic left heel pain with plantar fasciitis in the setting of bilateral pes planus.  We did perform our last treatment of extracorporeal shockwave therapy.  Given her improvement and response to this I would like to take a holiday for about 4 to 6 weeks and see how she does with continuation of her daily home exercises with plantar fascia strengthening and stretching of the posterior heel cord.  We will upgrade her from towel scrunches to picking up marbles for plantar fascia strengthening.  Recommended avoidance of barefoot walking, continuing good supportive shoe wear with supportive insoles.  She may use her Strassburg sock at night as needed.  She will let me know how she is doing over the next 4 to 6 weeks to help decide future treatment.  Okay for over-the-counter anti-inflammatories as needed.  She does have  her son's wedding upcoming in early September, congratulated her on this.  If she needs a short-term treatment/medicine prior to this, she will let me know.  Follow-up: Return in about 6 weeks (around 08/26/2024), or if symptoms worsen or fail to improve.   Meds & Orders: No orders of the defined types were placed in this encounter.  No orders of the defined types were placed in this encounter.    Procedures: Procedure: ECSWT Indications:  Plantar fasciitis   Procedure Details Consent: Risks of procedure as well as the alternatives and risks of each were explained to the patient.  Verbal consent for procedure obtained. Time Out: Verified patient identification, verified procedure, site was marked, verified correct patient position. The area was cleaned with alcohol swab.     The left plantar fascia was targeted for Extracorporeal shockwave therapy.    Preset: Plantar fasciitis  Power Level: 120 mJ Frequency: 15 Hz Impulse/cycles: 4000 Head size: Regular       Clinical History: No specialty comments available.  She reports that she has never smoked. She has never used smokeless tobacco. No results for input(s): HGBA1C, LABURIC in the last 8760 hours.  Objective:    Physical Exam  Gen: Well-appearing, in no acute distress; non-toxic CV: Well-perfused. Warm.  Resp: Breathing unlabored on room air; no wheezing. Psych: Fluid speech in conversation; appropriate affect; normal thought process  Ortho Exam - L-foot: Mild TTP over the plantar medial calcaneus and the medial band of the plantar  fascia.  Dorsiflexion 95-100 degrees.  No Achilles tendon contracture.  Imaging: No results found.  Past Medical/Family/Surgical/Social History: Medications & Allergies reviewed per EMR, new medications updated. 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 iron deficiency anemia 01/14/2024   Depression    Family history of breast cancer    Family history of colon cancer    Family history of melanoma    Family history of ovarian cancer    Family history of prostate cancer    Hashimoto's disease    Hyperlipidemia    Thyroid  disease    Family History  Problem Relation Age of Onset   Cancer Mother        body full of cancer per patient   Ovarian cancer Mother 59   Diabetes Mother    Hypertension Mother    Hyperlipidemia Mother    Breast cancer Mother 93   Heart disease Mother    Melanoma Mother 63   Lung cancer Father    Colon polyps Father    Prostate cancer Father 48   Breast cancer Maternal Grandmother 52   Heart disease Maternal Grandfather 63   Colon cancer Paternal Grandfather 17   Lung cancer Maternal Uncle 8   Lung cancer Paternal Aunt 37   Liver cancer Paternal Uncle    Liver cancer Cousin 55       mat first cousin   Stomach cancer Neg Hx    Esophageal cancer Neg Hx    Liver disease Neg Hx    Rectal cancer Neg Hx    Pancreatic cancer Neg Hx    Crohn's disease Neg Hx    Ulcerative colitis Neg Hx    Past Surgical History:  Procedure Laterality Date   ADENOIDECTOMY     COLONOSCOPY     KNEE SURGERY Right    MYRINGOTOMY     REDUCTION MAMMAPLASTY Bilateral 2016   Breast lift   uterine ablation- 2010  2010   dr holland for AUB   Social History   Occupational History   Not on file  Tobacco Use   Smoking status: Never   Smokeless tobacco: Never  Vaping Use   Vaping status: Never  Used  Substance and Sexual Activity   Alcohol use: Yes    Alcohol/week: 3.0 standard drinks of alcohol    Types: 3 Glasses of wine per week    Comment: WINE 1 X PER WEEK   Drug use: No   Sexual activity: Not on file

## 2024-07-15 NOTE — Progress Notes (Signed)
 Patient says that she is continuing to improve. She says that she feels about 75% better overall. She is worried that her pain will return if she stops doing the shockwave therapy.

## 2024-08-26 ENCOUNTER — Ambulatory Visit: Admitting: Sports Medicine

## 2024-09-15 ENCOUNTER — Ambulatory Visit: Payer: PRIVATE HEALTH INSURANCE | Admitting: Sports Medicine

## 2024-09-22 ENCOUNTER — Encounter: Payer: Self-pay | Admitting: Sports Medicine

## 2024-09-22 ENCOUNTER — Ambulatory Visit: Payer: PRIVATE HEALTH INSURANCE | Admitting: Sports Medicine

## 2024-09-22 ENCOUNTER — Other Ambulatory Visit (INDEPENDENT_AMBULATORY_CARE_PROVIDER_SITE_OTHER): Payer: PRIVATE HEALTH INSURANCE

## 2024-09-22 DIAGNOSIS — M2142 Flat foot [pes planus] (acquired), left foot: Secondary | ICD-10-CM

## 2024-09-22 DIAGNOSIS — M722 Plantar fascial fibromatosis: Secondary | ICD-10-CM | POA: Diagnosis not present

## 2024-09-22 DIAGNOSIS — M2141 Flat foot [pes planus] (acquired), right foot: Secondary | ICD-10-CM | POA: Diagnosis not present

## 2024-09-22 DIAGNOSIS — M79671 Pain in right foot: Secondary | ICD-10-CM | POA: Diagnosis not present

## 2024-09-22 NOTE — Progress Notes (Addendum)
 Elizabeth Gentry - 63 y.o. female MRN [id number]  Date of birth: Aug 29, 1961  Office Visit Note: Visit Date: 09/22/2024 PCP: Claudene Pellet, MD Referred by: Claudene Pellet, MD  Subjective: Chief Complaint  Patient presents with   Left Foot - Follow-up   HPI: Elizabeth Gentry is a pleasant 63 y.o. female who presents today for follow-up of left foot/plantar fascia pain and new-onset right heel pain.  Her left foot/plantar fasciits is doing excellent, essentially no pain.  She was able to wear low heels for her son's wedding, dance and not have any issues.  Unfortunately over the last month or so she has began having pain in the right heel.  This is more so on the lateral side compared to the medial side.  She does state it feels squishy in this location.  She has been doing her home exercises and stretches for both feet although the right continues.  She is been very adherent to wearing good sneakers, arch supports and the strap on the foot but still having right foot pain.  She has been working on healthy weight loss and has lost 12 pounds over the last few months, with that she has been walking 10,000 steps daily since August.  Pertinent ROS were reviewed with the patient and found to be negative unless otherwise specified above in HPI.   Assessment & Plan: Visit Diagnoses:  1. Pain of right heel   2. Plantar fasciitis of left foot   3. Pes planus of both feet    Plan: Impression is essentially resolved left foot plantar fasciitis which responded very well to multiple treatments of extracorporeal shockwave therapy and home rehab exercises/stretching.  Unfortunately she has had about 5 weeks of lateral based soft tissue heel pain on the right side without injury.  She does appear to have some soft tissue swelling/fluid in the soft tissue on the right side without redness or overlying skin changes. This is not really over the lateral band of the plantar fascia and her medial side is  rather benign.  I am curious of whether this is moreso purely inflammatory, and so would like her to be consistent with icing that area for about 20 minutes twice daily.  I also would like to reduce swelling/inflammatory effect with 1 tablet of Aleve twice daily as well. Will reduce ambulation to < 5k steps in interim. She will send me a message in about 2 weeks to see what degree of relief she receives.  If for some reason she is still having rather significant pain, next step may include MRI of the heel/foot given the slightly unusual location of her pain.  Follow-up: Return for She will mychart message me update in 2-weeks .   Meds & Orders: No orders of the defined types were placed in this encounter.   Orders Placed This Encounter  Procedures   XR Foot Complete Right     Procedures: No procedures performed      Clinical History: No specialty comments available.  She reports that she has never smoked. She has never used smokeless tobacco. No results for input(s): HGBA1C, LABURIC in the last 8760 hours.  Objective:    Physical Exam  Gen: Well-appearing, in no acute distress; non-toxic CV: Well-perfused. Warm.  Resp: Breathing unlabored on room air; no wheezing. Psych: Fluid speech in conversation; appropriate affect; normal thought process  Ortho Exam - Left foot: Pes planus noted.  No tenderness over the plantar fascia.  No redness or swelling.  -  Right foot: Pes planus noted.  There is rather sharp tenderness as well as some soft tissue swelling in the fat pad over the lateral calcaneus.  Negative calcaneal heel squeeze.  There does appear to be some cobblestoning/soft tissue swelling of the lateral heel soft tissue.  No specific tenderness over the medial band of the plantar fascia except with deep palpation.  Imaging: XR Foot Complete Right Result Date: 09/22/2024 3 views of the right foot including AP, oblique and lateral film were ordered and reviewed by myself today.   X-rays demonstrate moderate midfoot arthritic change most notable at the talonavicular joint.  There is a moderate-sized plantar calcaneal spur.  There is no specific cortical irregularity about the calcaneus.  Early MTP and bunion changes of the great toe.   Past Medical/Family/Surgical/Social History: Medications & Allergies reviewed per EMR, new medications updated. 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 01/14/2024   Depression    Family history of breast cancer    Family history of colon cancer    Family history of melanoma    Family history of ovarian cancer    Family history of prostate cancer    Hashimoto's disease    Hyperlipidemia    Thyroid  disease    Family History  Problem Relation Age of Onset   Cancer Mother        body full of cancer per patient   Ovarian cancer Mother 58   Diabetes Mother    Hypertension Mother    Hyperlipidemia Mother    Breast cancer Mother 95   Heart disease Mother    Melanoma Mother 40   Lung cancer Father    Colon polyps Father    Prostate cancer Father 88   Breast cancer Maternal Grandmother 75   Heart disease Maternal Grandfather 72   Colon cancer Paternal Grandfather 44   Lung cancer Maternal Uncle 31   Lung cancer Paternal Aunt 19   Liver cancer Paternal Uncle    Liver cancer Cousin 55       mat first cousin   Stomach cancer Neg Hx    Esophageal  cancer Neg Hx    Liver disease Neg Hx    Rectal cancer Neg Hx    Pancreatic cancer Neg Hx    Crohn's disease Neg Hx    Ulcerative colitis Neg Hx    Past Surgical History:  Procedure Laterality Date   ADENOIDECTOMY     COLONOSCOPY     KNEE SURGERY Right    MYRINGOTOMY     REDUCTION MAMMAPLASTY Bilateral 2016   Breast lift   uterine ablation- 2010  2010   dr holland for AUB   Social History   Occupational History   Not on file  Tobacco Use   Smoking status: Never   Smokeless tobacco: Never  Vaping Use  Vaping status: Never Used  Substance and Sexual Activity   Alcohol use: Yes    Alcohol/week: 3.0 standard drinks of alcohol    Types: 3 Glasses of wine per week    Comment: WINE 1 X PER WEEK   Drug use: No   Sexual activity: Not on file

## 2024-09-22 NOTE — Progress Notes (Signed)
 Patient says that her left foot is doing great. Over the last 5 weeks she has been having the same pain and symptoms in the right foot, although her pain is on the outside of the heel, rather than the inside of the heel. She has been doing her exercises and stretches for both feet and has not gotten relief from those. Her pain has gotten worse over the last 5 weeks. She says that she continues to wear good sneakers, and has tried the arch strap on the right foot.

## 2024-09-26 ENCOUNTER — Encounter: Payer: Self-pay | Admitting: Sports Medicine

## 2024-09-27 ENCOUNTER — Other Ambulatory Visit: Payer: Self-pay | Admitting: Sports Medicine

## 2024-09-27 DIAGNOSIS — M79671 Pain in right foot: Secondary | ICD-10-CM

## 2024-10-10 ENCOUNTER — Encounter: Payer: Self-pay | Admitting: Radiology

## 2024-10-11 ENCOUNTER — Other Ambulatory Visit: Payer: Self-pay | Admitting: Internal Medicine

## 2024-10-22 ENCOUNTER — Other Ambulatory Visit: Payer: PRIVATE HEALTH INSURANCE

## 2024-11-02 ENCOUNTER — Encounter: Payer: Self-pay | Admitting: Sports Medicine

## 2024-11-10 ENCOUNTER — Other Ambulatory Visit: Payer: PRIVATE HEALTH INSURANCE
